# Patient Record
Sex: Female | Born: 1980 | Race: White | Hispanic: Yes | Marital: Single | Smoking: Never smoker
Health system: Southern US, Community
[De-identification: ages and names within clinical notes are randomized; demographics above are authoritative.]

## PROBLEM LIST (undated history)

## (undated) DIAGNOSIS — D649 Anemia, unspecified: Secondary | ICD-10-CM

## (undated) DIAGNOSIS — R112 Nausea with vomiting, unspecified: Secondary | ICD-10-CM

## (undated) DIAGNOSIS — J189 Pneumonia, unspecified organism: Secondary | ICD-10-CM

## (undated) DIAGNOSIS — Z9889 Other specified postprocedural states: Secondary | ICD-10-CM

## (undated) HISTORY — PX: WISDOM TOOTH EXTRACTION: SHX21

## (undated) HISTORY — PX: KNEE SURGERY: SHX244

## (undated) HISTORY — PX: BREAST ENHANCEMENT SURGERY: SHX7

## (undated) HISTORY — PX: OTHER SURGICAL HISTORY: SHX169

---

## 2018-05-02 ENCOUNTER — Encounter: Payer: Self-pay | Admitting: Emergency Medicine

## 2018-05-02 ENCOUNTER — Other Ambulatory Visit: Payer: Self-pay

## 2018-05-02 ENCOUNTER — Ambulatory Visit
Admission: EM | Admit: 2018-05-02 | Discharge: 2018-05-02 | Disposition: A | Discharge status: Home / Self Care | Payer: BC Managed Care – PPO | Attending: Family Medicine | Admitting: Family Medicine

## 2018-05-02 DIAGNOSIS — S8002XA Contusion of left knee, initial encounter: Secondary | ICD-10-CM

## 2018-05-02 DIAGNOSIS — S300XXA Contusion of lower back and pelvis, initial encounter: Secondary | ICD-10-CM

## 2018-05-02 MED ORDER — NAPROXEN 500 MG PO TABS: 500.0000 mg | ORAL_TABLET | Freq: Two times a day (BID) | ORAL | 0 refills | Status: DC

## 2018-05-02 NOTE — ED Triage Notes (Signed)
Patient fell today stating that she slipped and fell today and landed on her buttocks today. Patient is c/o pain in her tail bone, left hip and bilateral knees and head pain. Patient is unsure if she hit her head or not.

## 2018-05-02 NOTE — Discharge Instructions (Addendum)
Apply ice 20 minutes out of every 2 hours 4-5 times daily for comfort.  °

## 2018-05-02 NOTE — ED Provider Notes (Signed)
[facility]    CSN: [id number] Arrival date & time: 05/02/18  1654     History   Chief Complaint Chief Complaint  Patient presents with  . Fall    DOI 05/02/18    HPI Hannah Mora is a 37 y.o. female.   HPI  37 year old female states that performing a walk-through inspection of her new home that she closes on tomorrow the floors had been previously waxed and she slipped against the walls and eventually ending up on her left buttock.  States she was embarrassed but later on that day while at work noticed that sitting caused her to have more discomfort.  She indicates the area on the left buttock lateral to the cleft.  Has minor discomfort extending out through the left sacroiliac joint.  She hit the left side of her head on the wall but had no sustained loss of consciousness.  Complains of anterior left knee pain indicating the tibial tubercle.  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        History reviewed. No pertinent past medical history.  There are no active problems to display for this patient.   Past Surgical History:  Procedure Laterality Date  . BREAST ENHANCEMENT SURGERY    . KNEE SURGERY Right    x 3    OB History   None      Home Medications    Prior to Admission medications   Medication Sig Start Date End Date Taking? Authorizing Provider  naproxen (NAPROSYN) 500 MG tablet Take 1 tablet (500 mg total) by mouth 2 (two) times daily with a meal. 05/02/18   Lutricia Feil, PA-C    Family History Family History  Problem Relation Age of Onset  . Diabetes Mother   . Hyperlipidemia Mother   . Hypertension Mother   . Asthma Mother   . Healthy Father     Social History Social History   Tobacco Use  . Smoking status: Never Smoker  . Smokeless tobacco: Never Used  Substance Use Topics  . Alcohol use: Yes    Comment: rarely  . Drug use: Never     Allergies   Patient has no  known allergies.   Review of Systems Review of Systems  Constitutional: Positive for activity change. Negative for appetite change, chills, fatigue and fever.  Musculoskeletal: Positive for back pain and myalgias.  All other systems reviewed and are negative.    Physical Exam Triage Vital Signs ED Triage Vitals  Enc Vitals Group     BP 05/02/18 1712 116/79     Pulse Rate 05/02/18 1712 76     Resp 05/02/18 1712 16     Temp 05/02/18 1712 98.2 F (36.8 C)     Temp Source 05/02/18 1712 Oral     SpO2 05/02/18 1712 100 %     Weight 05/02/18 1711 150 lb (68 kg)     Height 05/02/18 1711 5\' 2"  (1.575 m)     Head Circumference --      Peak Flow --      Pain Score 05/02/18 1711 7     Pain Loc --      Pain Edu? --      Excl. in [place]? --    No data found.  Updated Vital Signs BP 116/79 (BP Location: Left Arm)   Pulse 76   Temp 98.2 F (36.8 C) (Oral)   Resp 16   Ht 5'  2" (1.575 m)   Wt 150 lb (68 kg)   SpO2 100%   BMI 27.44 kg/m   Visual Acuity Right Eye Distance:   Left Eye Distance:   Bilateral Distance:    Right Eye Near:   Left Eye Near:    Bilateral Near:     Physical Exam  Constitutional: She is oriented to person, place, and time. She appears well-developed and well-nourished. No distress.  HENT:  Head: Normocephalic.  Eyes: Pupils are equal, round, and reactive to light. Right eye exhibits no discharge. Left eye exhibits no discharge.  Neck: Normal range of motion.  Musculoskeletal: Normal range of motion. She exhibits tenderness.  Exam of the lumbar spine was performed with Efraim KaufmannMelissa, CMA as chaperone and assistant.  Is no obvious bruising present on the lower back buttocks or lateral hip left.  Tenderness is just lateral to the buttocks cleft.  She also has tenderness over the lateral hip but again there is no bruising present.  Good range of motion to forward flexion and to lateral flexion bilaterally with discomfort at the extremes of left rightward lateral  flexion.  Exam of the left knee shows a small bruise over the tibial tubercle.  There is no effusion present.  She has no tenderness of the patella.  There is no lateral joint line tenderness.  No tenderness over the collateral ligaments bilaterally.  Collateral ligaments are intact when tested with stress.  She has a lax anterior cruciate but it does have a definite endpoint.  Range of motion of her knee.   Neurological: She is alert and oriented to person, place, and time.  Skin: Skin is warm and dry. She is not diaphoretic.  Psychiatric: She has a normal mood and affect. Her behavior is normal. Judgment and thought content normal.  Nursing note and vitals reviewed.    UC Treatments / Results  Labs (all labs ordered are listed, but only abnormal results are displayed) Labs Reviewed - No data to display  EKG None  Radiology No results found.  Procedures Procedures (including critical care time)  Medications Ordered in UC Medications - No data to display  Initial Impression / Assessment and Plan / UC Course  I have reviewed the triage vital signs and the nursing notes.  Pertinent labs & imaging results that were available during my care of the patient were reviewed by me and considered in my medical decision making (see chart for details).     Plan: 1. Test/x-ray results and diagnosis reviewed with patient 2. rx as per orders; risks, benefits, potential side effects reviewed with patient 3. Recommend supportive treatment with Apply ice 20 minutes out of every 2 hours 4-5 times daily for comfort.  I have advised the patient to take a conservative approach and see how her pain develops over time.  X-rays  are not indicated today.   we will treat her conservatively with ice, rest, symptom avoidance, Naprosyn with gradual resumption of her activities.  If she is not improving she may follow-up in our clinic or find a PCP. 4. F/u prn if symptoms worsen or don't improve  Final  Clinical Impressions(s) / UC Diagnoses   Final diagnoses:  Contusion of pelvic region, initial encounter  Contusion of left knee, initial encounter     Discharge Instructions     Apply ice 20 minutes out of every 2 hours 4-5 times daily for comfort.    ED Prescriptions    Medication Sig Dispense Auth. Provider  naproxen (NAPROSYN) 500 MG tablet Take 1 tablet (500 mg total) by mouth 2 (two) times daily with a meal. 60 tablet Lutricia Feil, PA-C     Controlled Substance Prescriptions [place] Controlled Substance Registry consulted? Not Applicable   Lutricia Feil, PA-C 05/02/18 1905

## 2020-02-11 ENCOUNTER — Ambulatory Visit
Admission: EM | Admit: 2020-02-11 | Discharge: 2020-02-11 | Disposition: A | Discharge status: Home / Self Care | Payer: BC Managed Care – PPO | Attending: Family Medicine | Admitting: Family Medicine

## 2020-02-11 ENCOUNTER — Other Ambulatory Visit: Payer: Self-pay

## 2020-02-11 ENCOUNTER — Ambulatory Visit (INDEPENDENT_AMBULATORY_CARE_PROVIDER_SITE_OTHER): Payer: BC Managed Care – PPO

## 2020-02-11 DIAGNOSIS — S6992XA Unspecified injury of left wrist, hand and finger(s), initial encounter: Secondary | ICD-10-CM

## 2020-02-11 NOTE — ED Provider Notes (Addendum)
[facility]    CSN: [id number] Arrival date & time: 02/11/20  1806  History   Chief Complaint Chief Complaint  Patient presents with  . Finger Injury    HPI 39 year old female presents with the above complaint.  Patient states that she was picking up a bag of laundry.  In doing so she inadvertently grabbed a laundry bag with her middle finger instead of all of her digits.  She states that her finger extended back quite a bit.  She states that in doing so she injured her finger.  She reports swelling of the digit also does not feel like it is straight.  He localizes the pain distal to the PIP joint.  She is not able to fully flex the joint but can flex some.  She is able to extend the joint as well as the DIP joint.  Injury occurred last night.  No relieving factors.  No other complaints.  Home Medications    Prior to Admission medications   Medication Sig Start Date End Date Taking? Authorizing Provider  Norgestimate-Ethinyl Estradiol Triphasic 0.18/0.215/0.25 MG-35 MCG tablet Take by mouth. 07/02/19 07/01/20 Yes [provider]  desogestrel-ethinyl estradiol (APRI) 0.15-30 MG-MCG tablet Take by mouth. 05/03/19 02/11/20 Yes [provider]    Family History Family History  Problem Relation Age of Onset  . Diabetes Mother   . Hyperlipidemia Mother   . Hypertension Mother   . Asthma Mother   . Healthy Father     Social History Social History   Tobacco Use  . Smoking status: Never Smoker  . Smokeless tobacco: Never Used  Vaping Use  . Vaping Use: Never used  Substance Use Topics  . Alcohol use: Yes    Comment: rarely  . Drug use: Never     Allergies   Patient has no known allergies.   Review of Systems Review of Systems  Musculoskeletal:       Finger injury - Right middle finger.   Physical Exam Triage Vital Signs ED Triage Vitals  Enc Vitals Group     BP 02/11/20 1828 119/84     Pulse Rate 02/11/20 1828 74     Resp 02/11/20  1828 15     Temp 02/11/20 1828 98.2 F (36.8 C)     Temp Source 02/11/20 1828 Oral     SpO2 02/11/20 1828 100 %     Weight 02/11/20 1827 150 lb (68 kg)     Height 02/11/20 1827 5\' 3"  (1.6 m)     Head Circumference --      Peak Flow --      Pain Score 02/11/20 1827 7     Pain Loc --      Pain Edu? --      Excl. in [place]? --    Updated Vital Signs BP 119/84 (BP Location: Left Arm)   Pulse 74   Temp 98.2 F (36.8 C) (Oral)   Resp 15   Ht 5\' 3"  (1.6 m)   Wt 68 kg   LMP 02/05/2020   SpO2 100%   BMI 26.57 kg/m   Visual Acuity Right Eye Distance:   Left Eye Distance:   Bilateral Distance:    Right Eye Near:   Left Eye Near:    Bilateral Near:     Physical Exam Constitutional:      General: She is not in acute distress.    Appearance: Normal appearance. She is not ill-appearing.  HENT:     Head:  Normocephalic and atraumatic.  Eyes:     General:        Right eye: No discharge.        Left eye: No discharge.     Conjunctiva/sclera: Conjunctivae normal.  Pulmonary:     Effort: Pulmonary effort is normal. No respiratory distress.  Musculoskeletal:     Comments: Right middle finger with tenderness distal to the PIP joint.  Minimal swelling.  Patient is able to flex at the DIP joint as well as the PIP joint.  She cannot fully flex at the PIP joint but flexion is intact.  Patient is able to extend at the PIP joint as well as the DIP joint.   Skin:    General: Skin is warm.     Findings: No bruising or rash.  Neurological:     Mental Status: She is alert.  Psychiatric:        Mood and Affect: Mood normal.        Behavior: Behavior normal.    UC Treatments / Results  Labs (all labs ordered are listed, but only abnormal results are displayed) Labs Reviewed - No data to display  EKG   Radiology No results found.  Procedures Procedures (including critical care time)  Medications Ordered in UC Medications - No data to display  Initial Impression / Assessment and  Plan / UC Course  I have reviewed the triage vital signs and the nursing notes.  Pertinent labs & imaging results that were available during my care of the patient were reviewed by me and considered in my medical decision making (see chart for details).    40 year old female presents with finger injury.  X-ray obtained today and independently reviewed by me.  No evidence of fracture.  There is no appreciable tendon injury.  Patient feels that her finger is deviated particularly distal to the DIP joint.  It is unclear to me whether this is a normal variation or other true pathology is there.  I do not appreciate any significant pathology on exam or on x-ray.  Advised rest, ice, elevation.  Ibuprofen as needed.  Advised to follow-up with hand surgery.  Final Clinical Impressions(s) / UC Diagnoses   Final diagnoses:  Injury of finger of left hand, initial encounter     Discharge Instructions     No evidence of fracture.  Please call [facility] [phone]) for an appt; Recommend seeing Dr. Stephenie Acres.  Rest, ice.  Ibuprofen as needed.  Take care  Dr. Adriana Simas      ED Prescriptions    None     PDMP not reviewed this encounter.   Tommie Sams, DO 02/11/20 2207    Tommie Sams, DO 03/18/20 [phone]

## 2020-02-11 NOTE — ED Triage Notes (Signed)
Pt reports she picked up a heavy laundry bad with her middle finger and it bent. Now swollen and still not quite straight.

## 2020-02-11 NOTE — Discharge Instructions (Signed)
No evidence of fracture.  Please call [facility] [phone]) for an appt; Recommend seeing Dr. Stephenie Acres.  Rest, ice.  Ibuprofen as needed.  Take care  Dr. Adriana Simas

## 2020-09-06 DIAGNOSIS — U071 COVID-19: Secondary | ICD-10-CM

## 2020-09-06 HISTORY — DX: COVID-19: U07.1

## 2021-04-07 ENCOUNTER — Observation Stay
Admission: AD | Admit: 2021-04-07 | Discharge: 2021-04-09 | Disposition: A | Discharge status: Home / Self Care | Payer: BC Managed Care – PPO | Attending: Emergency Medicine | Admitting: Emergency Medicine

## 2021-04-07 ENCOUNTER — Other Ambulatory Visit: Payer: Self-pay

## 2021-04-07 ENCOUNTER — Emergency Department: Payer: BC Managed Care – PPO

## 2021-04-07 ENCOUNTER — Encounter
Admission: AD | Disposition: A | Discharge status: Home / Self Care | Payer: Self-pay | Source: Ambulatory Visit | Attending: Surgery

## 2021-04-07 ENCOUNTER — Emergency Department
Admission: EM | Admit: 2021-04-07 | Discharge: 2021-04-07 | Disposition: A | Discharge status: Home / Self Care | Payer: BC Managed Care – PPO | Source: Home / Self Care | Attending: Emergency Medicine | Admitting: Emergency Medicine

## 2021-04-07 ENCOUNTER — Encounter: Payer: Self-pay | Admitting: Anesthesiology

## 2021-04-07 DIAGNOSIS — R109 Unspecified abdominal pain: Secondary | ICD-10-CM

## 2021-04-07 DIAGNOSIS — Z825 Family history of asthma and other chronic lower respiratory diseases: Secondary | ICD-10-CM

## 2021-04-07 DIAGNOSIS — U071 COVID-19: Secondary | ICD-10-CM | POA: Insufficient documentation

## 2021-04-07 DIAGNOSIS — K8050 Calculus of bile duct without cholangitis or cholecystitis without obstruction: Secondary | ICD-10-CM | POA: Insufficient documentation

## 2021-04-07 DIAGNOSIS — Z833 Family history of diabetes mellitus: Secondary | ICD-10-CM | POA: Diagnosis not present

## 2021-04-07 DIAGNOSIS — K810 Acute cholecystitis: Principal | ICD-10-CM | POA: Diagnosis present

## 2021-04-07 DIAGNOSIS — K8000 Calculus of gallbladder with acute cholecystitis without obstruction: Principal | ICD-10-CM | POA: Diagnosis present

## 2021-04-07 DIAGNOSIS — Z803 Family history of malignant neoplasm of breast: Secondary | ICD-10-CM

## 2021-04-07 DIAGNOSIS — Z83438 Family history of other disorder of lipoprotein metabolism and other lipidemia: Secondary | ICD-10-CM

## 2021-04-07 DIAGNOSIS — M549 Dorsalgia, unspecified: Secondary | ICD-10-CM | POA: Insufficient documentation

## 2021-04-07 DIAGNOSIS — R1010 Upper abdominal pain, unspecified: Secondary | ICD-10-CM | POA: Diagnosis present

## 2021-04-07 DIAGNOSIS — Z8249 Family history of ischemic heart disease and other diseases of the circulatory system: Secondary | ICD-10-CM | POA: Diagnosis not present

## 2021-04-07 LAB — COMPREHENSIVE METABOLIC PANEL
ALT: 13 U/L (ref 0–44)
AST: 20 U/L (ref 15–41)
Albumin: 4.2 g/dL (ref 3.5–5.0)
Alkaline Phosphatase: 97 U/L (ref 38–126)
Anion gap: 6 (ref 5–15)
BUN: 15 mg/dL (ref 6–20)
CO2: 26 mmol/L (ref 22–32)
Calcium: 8.8 mg/dL — ABNORMAL LOW (ref 8.9–10.3)
Chloride: 105 mmol/L (ref 98–111)
Creatinine, Ser: 0.7 mg/dL (ref 0.44–1.00)
GFR, Estimated: >60 mL/min (ref >60)
Glucose, Bld: 116 mg/dL — ABNORMAL HIGH (ref 70–99)
Potassium: 3.8 mmol/L (ref 3.5–5.1)
Sodium: 137 mmol/L (ref 135–145)
Total Bilirubin: 0.9 mg/dL (ref 0.3–1.2)
Total Protein: 7.6 g/dL (ref 6.5–8.1)

## 2021-04-07 LAB — CBC
HCT: 38.1 % (ref 36.0–46.0)
Hemoglobin: 12.6 g/dL (ref 12.0–15.0)
MCH: 27.6 pg (ref 26.0–34.0)
MCHC: 33.1 g/dL (ref 30.0–36.0)
MCV: 83.6 fL (ref 80.0–100.0)
Platelets: 253 10*3/uL (ref 150–400)
RBC: 4.56 MIL/uL (ref 3.87–5.11)
RDW: 13.9 % (ref 11.5–15.5)
WBC: 9.9 10*3/uL (ref 4.0–10.5)
nRBC: 0 % (ref 0.0–0.2)

## 2021-04-07 LAB — URINALYSIS, COMPLETE (UACMP) WITH MICROSCOPIC
Bilirubin Urine: NEGATIVE
Glucose, UA: NEGATIVE mg/dL
Hgb urine dipstick: NEGATIVE
Ketones, ur: 5 mg/dL — AB
Nitrite: NEGATIVE
Protein, ur: NEGATIVE mg/dL
Specific Gravity, Urine: 1.028 (ref 1.005–1.030)
pH: 6 (ref 5.0–8.0)

## 2021-04-07 LAB — PREGNANCY, URINE: Preg Test, Ur: NEGATIVE

## 2021-04-07 LAB — LIPASE, BLOOD: Lipase: 51 U/L (ref 11–51)

## 2021-04-07 LAB — SARS CORONAVIRUS 2 BY RT PCR (HOSPITAL ORDER, PERFORMED IN ~~LOC~~ HOSPITAL LAB): SARS Coronavirus 2: POSITIVE — AB

## 2021-04-07 SURGERY — CHOLECYSTECTOMY, ROBOT-ASSISTED, LAPAROSCOPIC
Anesthesia: Choice

## 2021-04-07 MED ORDER — OXYCODONE-ACETAMINOPHEN 5-325 MG PO TABS: 1.0000 | ORAL_TABLET | ORAL | Status: DC | PRN

## 2021-04-07 MED ORDER — IOHEXOL 350 MG/ML SOLN
100.0000 mL | Freq: Once | INTRAVENOUS | Status: AC | PRN
  Administered 2021-04-07: 100 mL via INTRAVENOUS
  Filled 2021-04-07: qty 100

## 2021-04-07 MED ORDER — MORPHINE SULFATE (PF) 2 MG/ML IV SOLN: 2.0000 mg | INTRAVENOUS | Status: DC | PRN

## 2021-04-07 MED ORDER — ONDANSETRON 4 MG PO TBDP: 4.0000 mg | ORAL_TABLET | Freq: Three times a day (TID) | ORAL | 0 refills | Status: AC | PRN

## 2021-04-07 MED ORDER — TRAMADOL HCL 50 MG PO TABS: 50.0000 mg | ORAL_TABLET | Freq: Four times a day (QID) | ORAL | Status: DC | PRN

## 2021-04-07 MED ORDER — ONDANSETRON 4 MG PO TBDP
4.0000 mg | ORAL_TABLET | Freq: Once | ORAL | Status: AC | PRN
  Administered 2021-04-07: 4 mg via ORAL
  Filled 2021-04-07: qty 1

## 2021-04-07 MED ORDER — EPINEPHRINE PF 1 MG/ML IJ SOLN
INTRAMUSCULAR | Status: AC
  Filled 2021-04-07: qty 1

## 2021-04-07 MED ORDER — LIDOCAINE HCL (PF) 1 % IJ SOLN
INTRAMUSCULAR | Status: AC
  Filled 2021-04-07: qty 30

## 2021-04-07 MED ORDER — INDOCYANINE GREEN 25 MG IV SOLR
1.2500 mg | Freq: Once | INTRAVENOUS | Status: DC
  Filled 2021-04-07: qty 10

## 2021-04-07 MED ORDER — FENTANYL CITRATE (PF) 100 MCG/2ML IJ SOLN
INTRAMUSCULAR | Status: AC
  Filled 2021-04-07: qty 2

## 2021-04-07 MED ORDER — SODIUM CHLORIDE 0.9 % IV SOLN
2.0000 g | INTRAVENOUS | Status: DC
  Administered 2021-04-07 – 2021-04-08 (×2): 2 g via INTRAVENOUS
  Filled 2021-04-07 (×2): qty 2

## 2021-04-07 MED ORDER — LACTATED RINGERS IV SOLN: INTRAVENOUS | Status: DC

## 2021-04-07 MED ORDER — PROPOFOL 10 MG/ML IV BOLUS
INTRAVENOUS | Status: AC
  Filled 2021-04-07: qty 20

## 2021-04-07 MED ORDER — HYDROCODONE-ACETAMINOPHEN 5-325 MG PO TABS
1.0000 | ORAL_TABLET | ORAL | Status: DC | PRN
Start: 2021-04-07 — End: 2021-04-10

## 2021-04-07 MED ORDER — MIDAZOLAM HCL 2 MG/2ML IJ SOLN
INTRAMUSCULAR | Status: AC
  Filled 2021-04-07: qty 2

## 2021-04-07 MED ORDER — OXYCODONE-ACETAMINOPHEN 5-325 MG PO TABS: 1.0000 | ORAL_TABLET | ORAL | 0 refills | Status: DC | PRN

## 2021-04-07 MED ORDER — ONDANSETRON 4 MG PO TBDP: 4.0000 mg | ORAL_TABLET | Freq: Three times a day (TID) | ORAL | Status: DC | PRN

## 2021-04-07 MED ORDER — DOCUSATE SODIUM 100 MG PO CAPS: 100.0000 mg | ORAL_CAPSULE | Freq: Two times a day (BID) | ORAL | Status: DC | PRN

## 2021-04-07 MED ORDER — BUPIVACAINE HCL (PF) 0.5 % IJ SOLN
INTRAMUSCULAR | Status: AC
  Filled 2021-04-07: qty 30

## 2021-04-07 SURGICAL SUPPLY — 54 items
ANCHOR TIS RET SYS 235ML (MISCELLANEOUS) ×2 IMPLANT
BAG INFUSER PRESSURE 100CC (MISCELLANEOUS) IMPLANT
BLADE SURG SZ11 CARB STEEL (BLADE) ×2 IMPLANT
CANISTER SUCT 1200ML W/VALVE (MISCELLANEOUS) ×2 IMPLANT
CANNULA REDUC XI 12-8 STAPL (CANNULA) ×1
CANNULA REDUCER 12-8 DVNC XI (CANNULA) ×1 IMPLANT
CATH REDDICK CHOLANGI 4FR 50CM (CATHETERS) IMPLANT
CHLORAPREP W/TINT 26 (MISCELLANEOUS) ×2 IMPLANT
CLIP VESOLOCK MED LG 6/CT (CLIP) ×2 IMPLANT
COVER TIP SHEARS 8 DVNC (MISCELLANEOUS) IMPLANT
COVER TIP SHEARS 8MM DA VINCI (MISCELLANEOUS)
DECANTER SPIKE VIAL GLASS SM (MISCELLANEOUS) ×4 IMPLANT
DEFOGGER SCOPE WARMER CLEARIFY (MISCELLANEOUS) ×2 IMPLANT
DERMABOND ADVANCED (GAUZE/BANDAGES/DRESSINGS) ×1
DERMABOND ADVANCED .7 DNX12 (GAUZE/BANDAGES/DRESSINGS) ×1 IMPLANT
DRAPE ARM DVNC X/XI (DISPOSABLE) ×4 IMPLANT
DRAPE C-ARM XRAY 36X54 (DRAPES) IMPLANT
DRAPE COLUMN DVNC XI (DISPOSABLE) ×1 IMPLANT
DRAPE DA VINCI XI ARM (DISPOSABLE) ×4
DRAPE DA VINCI XI COLUMN (DISPOSABLE) ×1
ELECT CAUTERY BLADE 6.4 (BLADE) ×2 IMPLANT
ELECT REM PT RETURN 9FT ADLT (ELECTROSURGICAL) ×2
ELECTRODE REM PT RTRN 9FT ADLT (ELECTROSURGICAL) ×1 IMPLANT
GAUZE 4X4 16PLY ~~LOC~~+RFID DBL (SPONGE) ×2 IMPLANT
GLOVE SURG SYN 6.5 ES PF (GLOVE) ×4 IMPLANT
GLOVE SURG UNDER POLY LF SZ7 (GLOVE) ×4 IMPLANT
GOWN STRL REUS W/ TWL LRG LVL3 (GOWN DISPOSABLE) ×3 IMPLANT
GOWN STRL REUS W/TWL LRG LVL3 (GOWN DISPOSABLE) ×3
GRASPER SUT TROCAR 14GX15 (MISCELLANEOUS) IMPLANT
IRRIGATOR SUCT 8 DISP DVNC XI (IRRIGATION / IRRIGATOR) IMPLANT
IRRIGATOR SUCTION 8MM XI DISP (IRRIGATION / IRRIGATOR)
IV NS 1000ML (IV SOLUTION)
IV NS 1000ML BAXH (IV SOLUTION) IMPLANT
LABEL OR SOLS (LABEL) ×2 IMPLANT
MANIFOLD NEPTUNE II (INSTRUMENTS) ×2 IMPLANT
NEEDLE HYPO 22GX1.5 SAFETY (NEEDLE) ×2 IMPLANT
NEEDLE INSUFFLATION 14GA 120MM (NEEDLE) ×2 IMPLANT
NS IRRIG 500ML POUR BTL (IV SOLUTION) ×2 IMPLANT
OBTURATOR OPTICAL STANDARD 8MM (TROCAR) ×1
OBTURATOR OPTICAL STND 8 DVNC (TROCAR) ×1
OBTURATOR OPTICALSTD 8 DVNC (TROCAR) ×1 IMPLANT
PACK LAP CHOLECYSTECTOMY (MISCELLANEOUS) ×2 IMPLANT
PENCIL ELECTRO HAND CTR (MISCELLANEOUS) ×2 IMPLANT
SEAL CANN UNIV 5-8 DVNC XI (MISCELLANEOUS) ×3 IMPLANT
SEAL XI 5MM-8MM UNIVERSAL (MISCELLANEOUS) ×3
SET TUBE SMOKE EVAC HIGH FLOW (TUBING) ×2 IMPLANT
SOLUTION ELECTROLUBE (MISCELLANEOUS) ×2 IMPLANT
STAPLER CANNULA SEAL DVNC XI (STAPLE) ×1 IMPLANT
STAPLER CANNULA SEAL XI (STAPLE) ×1
SUT MNCRL 4-0 (SUTURE) ×2
SUT MNCRL 4-0 27XMFL (SUTURE) ×2
SUT VICRYL 0 AB UR-6 (SUTURE) ×2 IMPLANT
SUTURE MNCRL 4-0 27XMF (SUTURE) ×2 IMPLANT
SYR 30ML LL (SYRINGE) IMPLANT

## 2021-04-07 NOTE — ED Notes (Signed)
Explained the wait    

## 2021-04-07 NOTE — ED Triage Notes (Signed)
Pt presents to ER c/o mid-back pain that radiates to abdomen. Pt states this has been going on for 1.5 weeks but has become worse in last 3 days.  Pt reports associated n/v with the pain.  Pt states she feels pain more at night, but cannot tell this rn if anything makes it better or worse.  Pt denies any pertinent pmh.

## 2021-04-07 NOTE — ED Provider Notes (Signed)
[facility] Emergency Department Provider Note   ____________________________________________   Event Date/Time   First MD Initiated Contact with Patient 04/07/21 [phone]     (approximate)  I have reviewed the triage vital signs and the nursing notes.   HISTORY  Chief Complaint Abdominal Pain and Back Pain    HPI Hannah Mora is a 40 y.o. female with no significant past medical history who presents to the ED complaining of back and abdominal pain.  Patient reports that she has had constant, gradually worsening pain in her bilateral flanks moving into her upper abdomen.  Pain is described as sharp and not exacerbated or alleviated by anything.  She states she will feel nauseous at times, but she has not vomited and denies any diarrhea, does endorse some constipation.  She has not had any fevers, dysuria, hematuria, vaginal bleeding, or discharge.  Her LMP was approximately 1 month ago and she does not think she could be pregnant.  She denies any cough, chest pain, or shortness of breath.  She has never had surgery on her abdomen, denies any past medical history.        History reviewed. No pertinent past medical history.  There are no problems to display for this patient.   Past Surgical History:  Procedure Laterality Date   BREAST ENHANCEMENT SURGERY     KNEE SURGERY Right    x 3    Prior to Admission medications   Medication Sig Start Date End Date Taking? Authorizing Provider  ondansetron (ZOFRAN ODT) 4 MG disintegrating tablet Take 1 tablet (4 mg total) by mouth every 8 (eight) hours as needed for nausea or vomiting. 04/07/21  Yes Chesley Noon, MD  oxyCODONE-acetaminophen (PERCOCET) 5-325 MG tablet Take 1 tablet by mouth every 4 (four) hours as needed for severe pain. 04/07/21 04/07/22 Yes Chesley Noon, MD  Norgestimate-Ethinyl Estradiol Triphasic 0.18/0.215/0.25 MG-35 MCG tablet Take by mouth. 07/02/19 07/01/20  [provider]   desogestrel-ethinyl estradiol (APRI) 0.15-30 MG-MCG tablet Take by mouth. 05/03/19 02/11/20  [provider]    Allergies Patient has no known allergies.  Family History  Problem Relation Age of Onset   Diabetes Mother    Hyperlipidemia Mother    Hypertension Mother    Asthma Mother    Healthy Father     Social History Social History   Tobacco Use   Smoking status: Never   Smokeless tobacco: Never  Vaping Use   Vaping Use: Never used  Substance Use Topics   Alcohol use: Yes    Comment: rarely   Drug use: Never    Review of Systems  Constitutional: No fever/chills Eyes: No visual changes. ENT: No sore throat. Cardiovascular: Denies chest pain. Respiratory: Denies shortness of breath. Gastrointestinal: Positive for flank pain, abdominal pain, and nausea, no vomiting.  No diarrhea.  No constipation. Genitourinary: Negative for dysuria. Musculoskeletal: Positive for back pain. Skin: Negative for rash. Neurological: Negative for headaches, focal weakness or numbness.  ____________________________________________   PHYSICAL EXAM:  VITAL SIGNS: ED Triage Vitals  Enc Vitals Group     BP 04/07/21 0242 (!) 149/83     Pulse Rate 04/07/21 0242 77     Resp 04/07/21 0242 16     Temp 04/07/21 0242 98.7 F (37.1 C)     Temp Source 04/07/21 0242 Oral     SpO2 04/07/21 0242 100 %     Weight 04/07/21 0240 155 lb (70.3 kg)     Height 04/07/21 0240 5\' 2"  (  1.575 m)     Head Circumference --      Peak Flow --      Pain Score 04/07/21 0240 10     Pain Loc --      Pain Edu? --      Excl. in [place]? --     Constitutional: Alert and oriented. Eyes: Conjunctivae are normal. Head: Atraumatic. Nose: No congestion/rhinnorhea. Mouth/Throat: Mucous membranes are moist. Neck: Normal ROM Cardiovascular: Normal rate, regular rhythm. Grossly normal heart sounds.  2+ radial pulses bilaterally. Respiratory: Normal respiratory effort.  No retractions. Lungs  CTAB. Gastrointestinal: Soft and tender to palpation in bilateral upper quadrants with no rebound or guarding.  CVA tenderness noted bilaterally. No distention. Genitourinary: deferred Musculoskeletal: No lower extremity tenderness nor edema. Neurologic:  Normal speech and language. No gross focal neurologic deficits are appreciated. Skin:  Skin is warm, dry and intact. No rash noted. Psychiatric: Mood and affect are normal. Speech and behavior are normal.  ____________________________________________   LABS (all labs ordered are listed, but only abnormal results are displayed)  Labs Reviewed  COMPREHENSIVE METABOLIC PANEL - Abnormal; Notable for the following components:      Result Value   Glucose, Bld 116 (*)    Calcium 8.8 (*)    All other components within normal limits  URINALYSIS, COMPLETE (UACMP) WITH MICROSCOPIC - Abnormal; Notable for the following components:   Color, Urine YELLOW (*)    APPearance HAZY (*)    Ketones, ur 5 (*)    Leukocytes,Ua TRACE (*)    Bacteria, UA RARE (*)    All other components within normal limits  LIPASE, BLOOD  CBC  PREGNANCY, URINE  POC URINE PREG, ED    PROCEDURES  Procedure(s) performed (including Critical Care):  Procedures   ____________________________________________   INITIAL IMPRESSION / ASSESSMENT AND PLAN / ED COURSE      40 year old female with no significant past medical history presents to the ED with 2 weeks of bilateral back/flank pain that seems to move into the bilateral upper quadrants of her abdomen.  Patient with tenderness to bilateral upper quadrants along with bilateral costovertebral areas on my assessment.  Initial labs are unremarkable and UA shows no signs of infection, pregnancy testing is negative.  We will further assess with CT scan to rule out kidney stones or biliary pathology, if this is unremarkable I suspect patient's symptoms are due to gastritis versus peptic ulcer disease.  CT scan is  significant for Cholelithiasis with borderline thickening of gallbladder wall.  We will further assess with ultrasound but low suspicion for cholecystitis at this time as patient states pain is resolving.  Patient turned over to oncoming provider pending ultrasound results, if these are unremarkable she would be appropriate for discharge home with general surgery follow-up, was prescribed small amount of pain and nausea medication.      ____________________________________________   FINAL CLINICAL IMPRESSION(S) / ED DIAGNOSES  Final diagnoses:  Abdominal pain  Biliary colic     ED Discharge Orders          Ordered    oxyCODONE-acetaminophen (PERCOCET) 5-325 MG tablet  Every 4 hours PRN        04/07/21 1103    ondansetron (ZOFRAN ODT) 4 MG disintegrating tablet  Every 8 hours PRN        04/07/21 1103             Note:  This document was prepared using Dragon voice recognition software and may include unintentional dictation  errors.    Chesley Noon, MD 04/07/21 1104

## 2021-04-07 NOTE — ED Notes (Signed)
Pt pregnancy test negative. 

## 2021-04-07 NOTE — ED Provider Notes (Signed)
Patient care assumed from Dr. Larinda Buttery.  Patient's ultrasound is equivocal however patient states she is now pain-free.  Normal LFTs, normal white blood cell count.  Suspect likely biliary colic.  Discussed with the patient surgery follow-up.  We will prescribe a short course of pain medication to use if needed.  Patient is agreeable to this plan of care.   Hannah Antis, MD 04/07/21 1315

## 2021-04-07 NOTE — OR Nursing (Signed)
Dr. Tonna Boehringer and Dr. Suzan Slick notified about pt being Covid +

## 2021-04-07 NOTE — OR Nursing (Signed)
Surgery postponed, floor RN notified and Anesthesia team.

## 2021-04-07 NOTE — ED Notes (Signed)
Dr Lenard Lance notified that pt states "I know I need surgery but I have some questions".  Awaiting ultrasound results, ultrasound tech states radiologist is reading her scan now.  Will update pt on status of ultrasound.

## 2021-04-07 NOTE — Progress Notes (Signed)
Covid positive. Discussed increased perioperative risks noted even in asymptomatic individuals based on limited data. Initially recommended abx to see if acute episode can be managed prior to proceeding in a few weeks as long as patient remains asymptomatic. She verbalized understanding and wishes to postpone surgery as long as pain can be controlled

## 2021-04-07 NOTE — H&P (Signed)
saSubjective:   CC: Acute cholecystitis [K81.0]  HPI: Hannah Mora is a 39 y.o. female who is consulted by [facility] for evaluation of above cc.  Symptoms were first noted 1 day ago. Pain is sharp  Associated with nausea, exacerbated by nothing specific.  Past Medical History:  has a past medical history of Pneumonia.  Past Surgical History:  has a past surgical history that includes Knee arthroscopy; Combined augmentation mammaplasty and abdominoplasty; and Augmentation mammaplasty (Bilateral).  Family History: family history includes Asthma in her mother; Breast cancer in her maternal grandmother; Diabetes in her mother; Diabetes type II in her mother; High blood pressure (Hypertension) in her mother; Hyperlipidemia (Elevated cholesterol) in her mother.  Social History:  reports that she has never smoked. She has never used smokeless tobacco. She reports previous drug use. She reports that she does not drink alcohol.  Current Medications: has a current medication list which includes the following prescription(s): acetaminophen, ascorbic acid (vitamin c), benzoyl peroxide, cholecalciferol, multivitamin, norgest-ethinyl estradiol triphasic, omeprazole, ondansetron, and oxycodone-acetaminophen.  Allergies:  Allergies as of 04/07/2021   (No Known Allergies)    ROS:  A 15 point review of systems was performed and pertinent positives and negatives noted in HPI    Objective:     BP 135/86   Pulse 102   Ht 157.5 cm (5\' 2" )   Wt 70.8 kg (156 lb)   BMI 28.53 kg/m    Constitutional :  alert, appears stated age, cooperative and no distress  Lymphatics/Throat:  no asymmetry, masses, or scars  Respiratory:  clear to auscultation bilaterally  Cardiovascular:  regular rate and rhythm  Gastrointestinal: soft, no guarding, TTP noted in RUQ.    Musculoskeletal: Steady gait and movement  Skin: Cool and moist  Psychiatric: Normal affect, non-agitated, not confused       LABS:  wnl    RADS: CLINICAL DATA:  Right upper quadrant and back pain   EXAM:  ULTRASOUND ABDOMEN LIMITED RIGHT UPPER QUADRANT   COMPARISON:  None.   FINDINGS:  Gallbladder:   A single large gallstone measuring up to 2.7 cm is visualized.  Sludge is present. Gallbladder wall thickening, measuring up to 5  mm. No pericholecystic fluid. Negative sonographic Murphy sign.   Common bile duct:   Diameter: 3.0   Liver:   No focal lesion identified. Within normal limits in parenchymal  echogenicity. Portal vein is patent on color Doppler imaging with  normal direction of blood flow towards the liver.   Other: None.   IMPRESSION:  Gallbladder wall thickening with a single large gallstone  visualized. Sonographic sign is negative. Findings are  equivocal for acute cholecystitis.    Electronically Signed    By: Eulah Pont MD    On: 04/07/2021 13:09  Assessment:      Acute cholecystitis [K81.0]  Plan:     1. Acute cholecystitis [K81.0] Discussed the risk of surgery including post-op infxn, seroma, biloma, chronic pain, poor-delayed wound healing, retained gallstone, conversion to open procedure, post-op SBO or ileus, and need for additional procedures to address said risks.  The risks of general anesthetic including MI, CVA, sudden death or even reaction to anesthetic medications also discussed. Alternatives include continued observation.  Benefits include possible symptom relief, prevention of complications including acute cholecystitis, pancreatitis.  Typical post operative recovery of 3-5 days rest, continued pain in area and incision sites, possible loose stools up to 4-6 weeks, also discussed.  ED return precautions given for sudden increase in RUQ  pain, with possible accompanying fever, nausea, and/or vomiting.  The patient understands the risks, any and all questions were answered to the patient's satisfaction.  2. Patient has elected to proceed with surgical  treatment. Procedure will be scheduled.  Written consent was obtained. robotic assisted laparoscopic.  Admit in the meantime

## 2021-04-08 ENCOUNTER — Encounter: Payer: Self-pay | Admitting: Surgery

## 2021-04-08 LAB — CBC
HCT: 36.7 % (ref 36.0–46.0)
Hemoglobin: 12.4 g/dL (ref 12.0–15.0)
MCH: 28.1 pg (ref 26.0–34.0)
MCHC: 33.8 g/dL (ref 30.0–36.0)
MCV: 83 fL (ref 80.0–100.0)
Platelets: 231 10*3/uL (ref 150–400)
RBC: 4.42 MIL/uL (ref 3.87–5.11)
RDW: 14.2 % (ref 11.5–15.5)
WBC: 8.4 10*3/uL (ref 4.0–10.5)
nRBC: 0 % (ref 0.0–0.2)

## 2021-04-08 LAB — HIV ANTIBODY (ROUTINE TESTING W REFLEX): HIV Screen 4th Generation wRfx: NONREACTIVE

## 2021-04-08 LAB — BASIC METABOLIC PANEL
Anion gap: 9 (ref 5–15)
BUN: 10 mg/dL (ref 6–20)
CO2: 24 mmol/L (ref 22–32)
Calcium: 8.9 mg/dL (ref 8.9–10.3)
Chloride: 104 mmol/L (ref 98–111)
Creatinine, Ser: 0.7 mg/dL (ref 0.44–1.00)
GFR, Estimated: >60 mL/min (ref >60)
Glucose, Bld: 100 mg/dL — ABNORMAL HIGH (ref 70–99)
Potassium: 3.8 mmol/L (ref 3.5–5.1)
Sodium: 137 mmol/L (ref 135–145)

## 2021-04-08 LAB — HEPATIC FUNCTION PANEL
ALT: 12 U/L (ref 0–44)
AST: 19 U/L (ref 15–41)
Albumin: 3.6 g/dL (ref 3.5–5.0)
Alkaline Phosphatase: 97 U/L (ref 38–126)
Bilirubin, Direct: 0.2 mg/dL (ref 0.0–0.2)
Indirect Bilirubin: 1.3 mg/dL — ABNORMAL HIGH (ref 0.3–0.9)
Total Bilirubin: 1.5 mg/dL — ABNORMAL HIGH (ref 0.3–1.2)
Total Protein: 6.8 g/dL (ref 6.5–8.1)

## 2021-04-08 LAB — PHOSPHORUS: Phosphorus: 4.1 mg/dL (ref 2.5–4.6)

## 2021-04-08 LAB — MAGNESIUM: Magnesium: 2.1 mg/dL (ref 1.7–2.4)

## 2021-04-08 MED ORDER — MENTHOL 3 MG MT LOZG
1.0000 | LOZENGE | OROMUCOSAL | Status: DC | PRN
  Administered 2021-04-08 – 2021-04-09 (×2): 3 mg via ORAL
  Filled 2021-04-08 (×2): qty 9

## 2021-04-09 LAB — HEPATIC FUNCTION PANEL
ALT: 14 U/L (ref 0–44)
AST: 22 U/L (ref 15–41)
Albumin: 3.7 g/dL (ref 3.5–5.0)
Alkaline Phosphatase: 94 U/L (ref 38–126)
Bilirubin, Direct: 0.1 mg/dL (ref 0.0–0.2)
Indirect Bilirubin: 1.1 mg/dL — ABNORMAL HIGH (ref 0.3–0.9)
Total Bilirubin: 1.2 mg/dL (ref 0.3–1.2)
Total Protein: 6.7 g/dL (ref 6.5–8.1)

## 2021-04-09 LAB — CBC
HCT: 37.4 % (ref 36.0–46.0)
Hemoglobin: 12.3 g/dL (ref 12.0–15.0)
MCH: 26.8 pg (ref 26.0–34.0)
MCHC: 32.9 g/dL (ref 30.0–36.0)
MCV: 81.5 fL (ref 80.0–100.0)
Platelets: 200 10*3/uL (ref 150–400)
RBC: 4.59 MIL/uL (ref 3.87–5.11)
RDW: 13.8 % (ref 11.5–15.5)
WBC: 9.1 10*3/uL (ref 4.0–10.5)
nRBC: 0 % (ref 0.0–0.2)

## 2021-04-09 LAB — BASIC METABOLIC PANEL
Anion gap: 8 (ref 5–15)
BUN: 12 mg/dL (ref 6–20)
CO2: 23 mmol/L (ref 22–32)
Calcium: 9 mg/dL (ref 8.9–10.3)
Chloride: 107 mmol/L (ref 98–111)
Creatinine, Ser: 0.68 mg/dL (ref 0.44–1.00)
GFR, Estimated: >60 mL/min (ref >60)
Glucose, Bld: 105 mg/dL — ABNORMAL HIGH (ref 70–99)
Potassium: 4.1 mmol/L (ref 3.5–5.1)
Sodium: 138 mmol/L (ref 135–145)

## 2021-04-09 LAB — MAGNESIUM: Magnesium: 2 mg/dL (ref 1.7–2.4)

## 2021-04-09 LAB — PHOSPHORUS: Phosphorus: 4.6 mg/dL (ref 2.5–4.6)

## 2021-04-09 MED ORDER — AMOXICILLIN-POT CLAVULANATE 875-125 MG PO TABS
1.0000 | ORAL_TABLET | Freq: Two times a day (BID) | ORAL | Status: DC
  Administered 2021-04-09: 1 via ORAL
  Filled 2021-04-09: qty 1

## 2021-04-09 MED ORDER — AMOXICILLIN-POT CLAVULANATE 875-125 MG PO TABS: 1.0000 | ORAL_TABLET | Freq: Two times a day (BID) | ORAL | 0 refills | Status: AC

## 2021-04-09 MED ORDER — IBUPROFEN 800 MG PO TABS: 800.0000 mg | ORAL_TABLET | Freq: Three times a day (TID) | ORAL | 0 refills | Status: AC | PRN

## 2021-04-09 MED ORDER — DOCUSATE SODIUM 100 MG PO CAPS: 100.0000 mg | ORAL_CAPSULE | Freq: Two times a day (BID) | ORAL | 0 refills | Status: AC | PRN

## 2021-04-09 NOTE — Plan of Care (Signed)

## 2021-04-09 NOTE — Progress Notes (Signed)
Mobility Specialist - Progress Note   04/09/21 1400  Mobility  Activity Ambulated in room  Level of Assistance Independent  Assistive Device None  Distance Ambulated (ft) 80 ft  Mobility Ambulated independently in room  Mobility Response Tolerated well  Mobility performed by Mobility specialist  $Mobility charge 1 Mobility    Post-mobility: 89 HR, 100% SpO2   Pt ambulated in room independently. No SOB on RA. No complaints.    Filiberto Pinks Mobility Specialist 04/09/21, 2:53 PM

## 2021-04-09 NOTE — Progress Notes (Signed)
Subjective:  CC: Hannah Mora is a 40 y.o. female  Hospital stay day 1, acute chole HPI: No issues today, no pain, tolerating clears  ROS:  General: Denies weight loss, weight gain, fatigue, fevers, chills, and night sweats. Heart: Denies chest pain, palpitations, racing heart, irregular heartbeat, leg pain or swelling, and decreased activity tolerance. Respiratory: Denies breathing difficulty, shortness of breath, wheezing, cough, and sputum. GI: Denies change in appetite, heartburn, nausea, vomiting, constipation, diarrhea, and blood in stool. GU: Denies difficulty urinating, pain with urinating, urgency, frequency, blood in urine.   Objective:   VSS  Intake/Output this shift:  N/a  Constitutional :  alert, cooperative, appears stated age, and no distress  Respiratory:  clear to auscultation bilaterally  Cardiovascular:  regular rate and rhythm  Gastrointestinal: soft, non-tender; bowel sounds normal; no masses,  no organomegaly.   Skin: Cool and moist.   Psychiatric: Normal affect, non-agitated, not confused       LABS:  WNL   RADS: N/a Assessment:   Acute chole.  COVID positive. Asymptomatic for now , but will defer surgical intervention to minimize any risk.  Symptoms resolved from chole standpoint.  ADAT and continue to monitor

## 2021-04-10 NOTE — Discharge Summary (Signed)
Physician Discharge Summary  Patient ID: Hannah Mora MRN: [id number] DOB/AGE: December 14, 1980 40 y.o.  Admit date: 04/07/2021 Discharge date: 04/09/2021  Admission Diagnoses: Acute cholecystitis, COVID-positive  Discharge Diagnoses:  Same as above  Discharged Condition: good  Hospital Course: Work-up in clinic revealed concerns for acute cholecystitis, admitted pending robotic assisted lap chole.  Unfortunately patient tested positive for COVID.  Asymptomatic at the time of the admission.  Had an extensive discussion regarding risk benefits alternatives of proceeding with lap chole with a positive COVID test, including some studies citing possible increase even in asymptomatic COVID-positive individuals undergoing surgery.  Alternative is being continue with antibiotics for now, since despite the imaging findings and initial presentation of biliary colic type pain, patient was asymptomatic by the time she was admitted and continued to be asymptomatic throughout her hospital stay.  After much discussion, group consensus was to continue antibiotic treatment and postpone the surgery until she remains asymptomatic for 10 days per hospital policy to undergo lap chole.  She fortunately continued to remain asymptomatic from gallbladder standpoint despite resuming diet and her labs continue to look reassuring.  She was deemed safe to be discharged on oral antibiotics with close follow-up as needed until the surgery.  Of note, she did start developing a minor sore throat as well as some nasal discharge, concerns being this may potentially be the beginning of her symptoms of COVID.  She was instructed to call the office and proceed with scheduling the surgery, with hopes that she does not develop any further symptoms from COVID or have any new onset biliary colic in the meantime.  Consults: None  Discharge Exam: Blood pressure 97/69, pulse 80, temperature 98 F (36.7 C), temperature source Oral, resp. rate  18, height 5\' 2"  (1.575 m), weight 70 kg, SpO2 97 %. General appearance: alert, cooperative, and no distress GI: soft, non-tender; bowel sounds normal; no masses,  no organomegaly  Disposition:  Discharge disposition: 01-Home or Self Care       Discharge Instructions     Discharge patient   Complete by: As directed    Discharge disposition: 01-Home or Self Care   Discharge patient date: 04/09/2021      Allergies as of 04/09/2021   No Known Allergies      Medication List     TAKE these medications    acetaminophen 500 MG tablet Commonly known as: TYLENOL Take by mouth.   amoxicillin-clavulanate 875-125 MG tablet Commonly known as: Augmentin Take 1 tablet by mouth 2 (two) times daily for 7 days.   docusate sodium 100 MG capsule Commonly known as: Colace Take 1 capsule (100 mg total) by mouth 2 (two) times daily as needed for up to 10 days for mild constipation.   ibuprofen 800 MG tablet Commonly known as: ADVIL Take 1 tablet (800 mg total) by mouth every 8 (eight) hours as needed for mild pain or moderate pain.   Norgestimate-Ethinyl Estradiol Triphasic 0.18/0.215/0.25 MG-35 MCG tablet Take by mouth.   ondansetron 4 MG disintegrating tablet Commonly known as: Zofran ODT Take 1 tablet (4 mg total) by mouth every 8 (eight) hours as needed for nausea or vomiting.   oxyCODONE-acetaminophen 5-325 MG tablet Commonly known as: Percocet Take 1 tablet by mouth every 4 (four) hours as needed for severe pain.        Follow-up Information     Faywood, Xaine Sansom, DO. Call.   Specialty: Surgery Why: to schedule surgery as long as asymptomatic from COVID Contact information: [address]  [place] [address] [phone]                  Total time spent arranging discharge was >20min. Signed: Sung Amabile 04/10/2021, 1:51 PM

## 2021-04-15 ENCOUNTER — Ambulatory Visit: Payer: Self-pay | Admitting: Surgery

## 2021-04-15 ENCOUNTER — Other Ambulatory Visit: Payer: Self-pay

## 2021-04-15 ENCOUNTER — Other Ambulatory Visit
Admission: RE | Admit: 2021-04-15 | Discharge: 2021-04-15 | Disposition: A | Discharge status: Home / Self Care | Payer: PRIVATE HEALTH INSURANCE | Source: Ambulatory Visit | Attending: Surgery | Admitting: Surgery

## 2021-04-15 HISTORY — DX: Pneumonia, unspecified organism: J18.9

## 2021-04-15 HISTORY — DX: Other specified postprocedural states: Z98.890

## 2021-04-15 HISTORY — DX: Anemia, unspecified: D64.9

## 2021-04-15 HISTORY — DX: Other specified postprocedural states: R11.2

## 2021-04-15 NOTE — Patient Instructions (Addendum)
Your procedure is scheduled on: 04/24/21  Report to the Registration Desk on the 1st floor of the [facility]. To find out your arrival time, please call [phone] between 1PM - 3PM on: 04/23/21  REMEMBER: Instructions that are not followed completely may result in serious medical risk, up to and including death; or upon the discretion of your surgeon and anesthesiologist your surgery may need to be rescheduled.  Do not eat food after midnight the night before surgery.  No gum chewing, lozengers or hard candies.  You may however, drink CLEAR liquids up to 2 hours before you are scheduled to arrive for your surgery. Do not drink anything within 2 hours of your scheduled arrival time.  Clear liquids include: - water  - apple juice without pulp - gatorade (not RED, PURPLE, OR BLUE) - black coffee or tea (Do NOT add milk or creamers to the coffee or tea) Do NOT drink anything that is not on this list.  TAKE THESE MEDICATIONS THE MORNING OF SURGERY WITH A SIP OF WATER: None  One week prior to surgery: Stop Anti-inflammatories (NSAIDS) such as Advil, Aleve, Ibuprofen, Motrin, Naproxen, Naprosyn and Aspirin based products such as Excedrin, Goodys Powder, BC Powder.  Stop ANY OVER THE COUNTER supplements until after surgery.  You may however, continue to take Tylenol if needed for pain up until the day of surgery.  No Alcohol for 24 hours before or after surgery.  No Smoking including e-cigarettes for 24 hours prior to surgery.  No chewable tobacco products for at least 6 hours prior to surgery.  No nicotine patches on the day of surgery.  Do not use any "recreational" drugs for at least a week prior to your surgery.  Please be advised that the combination of cocaine and anesthesia may have negative outcomes, up to and including death. If you test positive for cocaine, your surgery will be cancelled.  On the morning of surgery brush your teeth with toothpaste and water, you may  rinse your mouth with mouthwash if you wish. Do not swallow any toothpaste or mouthwash.  Do not wear jewelry, make-up, hairpins, clips or nail polish.  Do not wear lotions, powders, or perfumes.   Do not shave body from the neck down 48 hours prior to surgery just in case you cut yourself which could leave a site for infection.  Also, freshly shaved skin may become irritated if using the CHG soap.  Contact lenses, hearing aids and dentures may not be worn into surgery.  Do not bring valuables to the hospital. [facility] is not responsible for any missing/lost belongings or valuables.   Use CHG Soap or wipes as directed on instruction sheet.  Notify your doctor if there is any change in your medical condition (cold, fever, infection).  Wear comfortable clothing (specific to your surgery type) to the hospital.  After surgery, you can help prevent lung complications by doing breathing exercises.  Take deep breaths and cough every 1-2 hours. Your doctor may order a device called an Incentive Spirometer to help you take deep breaths. When coughing or sneezing, hold a pillow firmly against your incision with both hands. This is called "splinting." Doing this helps protect your incision. It also decreases belly discomfort.  If you are being admitted to the hospital overnight, leave your suitcase in the car. After surgery it may be brought to your room.  If you are being discharged the day of surgery, you will not be allowed to drive home.   You will need a responsible adult (18 years or older) to drive you home and stay with you that night.   If you are taking public transportation, you will need to have a responsible adult (18 years or older) with you. Please confirm with your physician that it is acceptable to use public transportation.   Please call the Pre-admissions Testing Dept. at [phone] if you have any questions about these instructions.  Surgery Visitation  Policy:  Patients undergoing a surgery or procedure may have one family member or support person with them as long as that person is not COVID-19 positive or experiencing its symptoms.  That person may remain in the waiting area during the procedure.  Inpatient Visitation:    Visiting hours are 7 a.m. to 8 p.m. Inpatients will be allowed two visitors daily. The visitors may change each day during the patient's stay. No visitors under the age of 12. Any visitor under the age of 18 must be accompanied by an adult. The visitor must pass COVID-19 screenings, use hand sanitizer when entering and exiting the patient's room and wear a mask at all times, including in the patient's room. Patients must also wear a mask when staff or their visitor are in the room. Masking is required regardless of vaccination status.  

## 2021-04-15 NOTE — H&P (View-Only) (Signed)
CC: Acute cholecystitis [K81.0]  HPI: Hannah Mora is a 40 y.o. female who is consulted by [facility] for evaluation of above cc. Symptoms were first noted 1 day ago. Pain is sharp Associated with nausea, exacerbated by nothing specific.  Past Medical History: has a past medical history of Pneumonia.  Past Surgical History: has a past surgical history that includes Knee arthroscopy; Combined augmentation mammaplasty and abdominoplasty; and Augmentation mammaplasty (Bilateral).  Family History: family history includes Asthma in her mother; Breast cancer in her maternal grandmother; Diabetes in her mother; Diabetes type II in her mother; High blood pressure (Hypertension) in her mother; Hyperlipidemia (Elevated cholesterol) in her mother.  Social History: reports that she has never smoked. She has never used smokeless tobacco. She reports previous drug use. She reports that she does not drink alcohol.  Current Medications: has a current medication list which includes the following prescription(s): acetaminophen, ascorbic acid (vitamin c), benzoyl peroxide, cholecalciferol, multivitamin, norgest-ethinyl estradiol triphasic, omeprazole, ondansetron, and oxycodone-acetaminophen.  Allergies:  Allergies as of 04/07/2021   (No Known Allergies)   ROS:  A 15 point review of systems was performed and pertinent positives and negatives noted in HPI   Objective:    BP 135/86  Pulse 102  Ht 157.5 cm (5\' 2" )  Wt 70.8 kg (156 lb)  BMI 28.53 kg/m   Constitutional : alert, appears stated age, cooperative and no distress  Lymphatics/Throat: no asymmetry, masses, or scars  Respiratory: clear to auscultation bilaterally  Cardiovascular: regular rate and rhythm  Gastrointestinal: soft, no guarding, TTP noted in RUQ.  Musculoskeletal: Steady gait and movement  Skin: Cool and moist  Psychiatric: Normal affect, non-agitated, not confused    LABS:  wnl   RADS: CLINICAL DATA:  Right upper  quadrant and back pain   EXAM:  ULTRASOUND ABDOMEN LIMITED RIGHT UPPER QUADRANT   COMPARISON:  None.   FINDINGS:  Gallbladder:   A single large gallstone measuring up to 2.7 cm is visualized.  Sludge is present. Gallbladder wall thickening, measuring up to 5  mm. No pericholecystic fluid. Negative sonographic Murphy sign.   Common bile duct:   Diameter: 3.0   Liver:   No focal lesion identified. Within normal limits in parenchymal  echogenicity. Portal vein is patent on color Doppler imaging with  normal direction of blood flow towards the liver.   Other: None.   IMPRESSION:  Gallbladder wall thickening with a single large gallstone  visualized. Sonographic sign is negative. Findings are  equivocal for acute cholecystitis.   Electronically Signed    By: Eulah Pont MD    On: 04/07/2021 13:09  Assessment:    Acute cholecystitis [K81.0]  Plan:    1. Acute cholecystitis [K81.0] Discussed the risk of surgery including post-op infxn, seroma, biloma, chronic pain, poor-delayed wound healing, retained gallstone, conversion to open procedure, post-op SBO or ileus, and need for additional procedures to address said risks. The risks of general anesthetic including MI, CVA, sudden death or even reaction to anesthetic medications also discussed. Alternatives include continued observation. Benefits include possible symptom relief, prevention of complications including acute cholecystitis, pancreatitis.  Typical post operative recovery of 3-5 days rest, continued pain in area and incision sites, possible loose stools up to 4-6 weeks, also discussed.  ED return precautions given for sudden increase in RUQ pain, with possible accompanying fever, nausea, and/or vomiting.  The patient understands the risks, any and all questions were answered to the patient's satisfaction.  2. Patient has elected to proceed  with surgical treatment. Procedure will be scheduled. Written  consent was obtained. robotic assisted laparoscopic.

## 2021-04-15 NOTE — H&P (Signed)
CC: Acute cholecystitis [K81.0]  HPI: Hannah Mora is a 40 y.o. female who is consulted by [facility] for evaluation of above cc. Symptoms were first noted 1 day ago. Pain is sharp Associated with nausea, exacerbated by nothing specific.  Past Medical History: has a past medical history of Pneumonia.  Past Surgical History: has a past surgical history that includes Knee arthroscopy; Combined augmentation mammaplasty and abdominoplasty; and Augmentation mammaplasty (Bilateral).  Family History: family history includes Asthma in her mother; Breast cancer in her maternal grandmother; Diabetes in her mother; Diabetes type II in her mother; High blood pressure (Hypertension) in her mother; Hyperlipidemia (Elevated cholesterol) in her mother.  Social History: reports that she has never smoked. She has never used smokeless tobacco. She reports previous drug use. She reports that she does not drink alcohol.  Current Medications: has a current medication list which includes the following prescription(s): acetaminophen, ascorbic acid (vitamin c), benzoyl peroxide, cholecalciferol, multivitamin, norgest-ethinyl estradiol triphasic, omeprazole, ondansetron, and oxycodone-acetaminophen.  Allergies:  Allergies as of 04/07/2021   (No Known Allergies)   ROS:  A 15 point review of systems was performed and pertinent positives and negatives noted in HPI   Objective:    BP 135/86  Pulse 102  Ht 157.5 cm (5' 2")  Wt 70.8 kg (156 lb)  BMI 28.53 kg/m   Constitutional : alert, appears stated age, cooperative and no distress  Lymphatics/Throat: no asymmetry, masses, or scars  Respiratory: clear to auscultation bilaterally  Cardiovascular: regular rate and rhythm  Gastrointestinal: soft, no guarding, TTP noted in RUQ.  Musculoskeletal: Steady gait and movement  Skin: Cool and moist  Psychiatric: Normal affect, non-agitated, not confused    LABS:  wnl   RADS: CLINICAL DATA:  Right upper  quadrant and back pain   EXAM:  ULTRASOUND ABDOMEN LIMITED RIGHT UPPER QUADRANT   COMPARISON:  None.   FINDINGS:  Gallbladder:   A single large gallstone measuring up to 2.7 cm is visualized.  Sludge is present. Gallbladder wall thickening, measuring up to 5  mm. No pericholecystic fluid. Negative sonographic Murphy sign.   Common bile duct:   Diameter: 3.0   Liver:   No focal lesion identified. Within normal limits in parenchymal  echogenicity. Portal vein is patent on color Doppler imaging with  normal direction of blood flow towards the liver.   Other: None.   IMPRESSION:  Gallbladder wall thickening with a single large gallstone  visualized. Sonographic Murphy sign is negative. Findings are  equivocal for acute cholecystitis.   Electronically Signed    By: Leah  Strickland MD    On: 04/07/2021 13:09  Assessment:    Acute cholecystitis [K81.0]  Plan:    1. Acute cholecystitis [K81.0] Discussed the risk of surgery including post-op infxn, seroma, biloma, chronic pain, poor-delayed wound healing, retained gallstone, conversion to open procedure, post-op SBO or ileus, and need for additional procedures to address said risks. The risks of general anesthetic including MI, CVA, sudden death or even reaction to anesthetic medications also discussed. Alternatives include continued observation. Benefits include possible symptom relief, prevention of complications including acute cholecystitis, pancreatitis.  Typical post operative recovery of 3-5 days rest, continued pain in area and incision sites, possible loose stools up to 4-6 weeks, also discussed.  ED return precautions given for sudden increase in RUQ pain, with possible accompanying fever, nausea, and/or vomiting.  The patient understands the risks, any and all questions were answered to the patient's satisfaction.  2. Patient has elected to proceed   with surgical treatment. Procedure will be scheduled. Written  consent was obtained. robotic assisted laparoscopic.

## 2021-04-23 MED ORDER — INDOCYANINE GREEN 25 MG IV SOLR
1.2500 mg | Freq: Once | INTRAVENOUS | Status: AC
  Administered 2021-04-24: 1.25 mg via INTRAVENOUS
  Filled 2021-04-23: qty 10
  Filled 2021-04-23: qty 0.5

## 2021-04-23 MED ORDER — CEFAZOLIN SODIUM-DEXTROSE 2-4 GM/100ML-% IV SOLN
2.0000 g | INTRAVENOUS | Status: AC
  Administered 2021-04-24: 2 g via INTRAVENOUS

## 2021-04-24 ENCOUNTER — Ambulatory Visit: Payer: BC Managed Care – PPO | Admitting: Anesthesiology

## 2021-04-24 ENCOUNTER — Ambulatory Visit
Admission: RE | Admit: 2021-04-24 | Discharge: 2021-04-24 | Disposition: A | Discharge status: Home / Self Care | Payer: BC Managed Care – PPO | Attending: Surgery | Admitting: Surgery

## 2021-04-24 ENCOUNTER — Other Ambulatory Visit: Payer: Self-pay

## 2021-04-24 ENCOUNTER — Encounter
Admission: RE | Disposition: A | Discharge status: Home / Self Care | Payer: Self-pay | Source: Home / Self Care | Attending: Surgery

## 2021-04-24 ENCOUNTER — Encounter: Payer: Self-pay | Admitting: Surgery

## 2021-04-24 DIAGNOSIS — K8012 Calculus of gallbladder with acute and chronic cholecystitis without obstruction: Secondary | ICD-10-CM | POA: Diagnosis present

## 2021-04-24 DIAGNOSIS — K810 Acute cholecystitis: Secondary | ICD-10-CM

## 2021-04-24 LAB — POCT PREGNANCY, URINE: Preg Test, Ur: NEGATIVE

## 2021-04-24 SURGERY — CHOLECYSTECTOMY, ROBOT-ASSISTED, LAPAROSCOPIC
Anesthesia: General | Site: Abdomen

## 2021-04-24 MED ORDER — OXYCODONE HCL 5 MG PO TABS: 5.0000 mg | ORAL_TABLET | Freq: Once | ORAL | Status: DC | PRN

## 2021-04-24 MED ORDER — LIDOCAINE HCL (PF) 1 % IJ SOLN
INTRAMUSCULAR | Status: AC
  Filled 2021-04-24: qty 30

## 2021-04-24 MED ORDER — SUGAMMADEX SODIUM 200 MG/2ML IV SOLN
INTRAVENOUS | Status: DC | PRN
Start: 2021-04-24 — End: 2021-04-24
  Administered 2021-04-24: 200 mg via INTRAVENOUS

## 2021-04-24 MED ORDER — PROPOFOL 500 MG/50ML IV EMUL
INTRAVENOUS | Status: DC | PRN
  Administered 2021-04-24: 150 ug/kg/min via INTRAVENOUS

## 2021-04-24 MED ORDER — APREPITANT 40 MG PO CAPS
40.0000 mg | ORAL_CAPSULE | Freq: Once | ORAL | Status: AC
  Administered 2021-04-24: 40 mg via ORAL

## 2021-04-24 MED ORDER — CELECOXIB 200 MG PO CAPS
ORAL_CAPSULE | ORAL | Status: AC
  Administered 2021-04-24: 200 mg via ORAL
  Filled 2021-04-24: qty 1

## 2021-04-24 MED ORDER — DEXAMETHASONE SODIUM PHOSPHATE 10 MG/ML IJ SOLN
INTRAMUSCULAR | Status: AC
  Filled 2021-04-24: qty 1

## 2021-04-24 MED ORDER — ORAL CARE MOUTH RINSE: 15.0000 mL | Freq: Once | OROMUCOSAL | Status: AC

## 2021-04-24 MED ORDER — CEFAZOLIN SODIUM-DEXTROSE 2-4 GM/100ML-% IV SOLN
INTRAVENOUS | Status: AC
  Filled 2021-04-24: qty 100

## 2021-04-24 MED ORDER — MEPERIDINE HCL 25 MG/ML IJ SOLN: 6.2500 mg | INTRAMUSCULAR | Status: DC | PRN

## 2021-04-24 MED ORDER — DOCUSATE SODIUM 100 MG PO CAPS: 100.0000 mg | ORAL_CAPSULE | Freq: Two times a day (BID) | ORAL | 0 refills | Status: AC | PRN

## 2021-04-24 MED ORDER — HYDROCODONE-ACETAMINOPHEN 5-325 MG PO TABS: 1.0000 | ORAL_TABLET | Freq: Four times a day (QID) | ORAL | 0 refills | Status: AC | PRN

## 2021-04-24 MED ORDER — DEXAMETHASONE SODIUM PHOSPHATE 10 MG/ML IJ SOLN
INTRAMUSCULAR | Status: DC | PRN
  Administered 2021-04-24: 10 mg via INTRAVENOUS

## 2021-04-24 MED ORDER — GABAPENTIN 300 MG PO CAPS: 300.0000 mg | ORAL_CAPSULE | ORAL | Status: AC

## 2021-04-24 MED ORDER — CELECOXIB 200 MG PO CAPS: 200.0000 mg | ORAL_CAPSULE | ORAL | Status: AC

## 2021-04-24 MED ORDER — PROMETHAZINE HCL 25 MG/ML IJ SOLN
INTRAMUSCULAR | Status: AC
  Filled 2021-04-24: qty 1

## 2021-04-24 MED ORDER — ONDANSETRON HCL 4 MG/2ML IJ SOLN
INTRAMUSCULAR | Status: DC | PRN
Start: 2021-04-24 — End: 2021-04-24
  Administered 2021-04-24: 4 mg via INTRAVENOUS

## 2021-04-24 MED ORDER — KETAMINE HCL 50 MG/5ML IJ SOSY
PREFILLED_SYRINGE | INTRAMUSCULAR | Status: AC
  Filled 2021-04-24: qty 5

## 2021-04-24 MED ORDER — SODIUM CHLORIDE FLUSH 0.9 % IV SOLN
INTRAVENOUS | Status: AC
  Filled 2021-04-24: qty 10

## 2021-04-24 MED ORDER — CHLORHEXIDINE GLUCONATE 0.12 % MT SOLN
OROMUCOSAL | Status: AC
  Administered 2021-04-24: 15 mL via OROMUCOSAL
  Filled 2021-04-24: qty 15

## 2021-04-24 MED ORDER — PROMETHAZINE HCL 25 MG/ML IJ SOLN
6.2500 mg | INTRAMUSCULAR | Status: DC | PRN
Start: 2021-04-24 — End: 2021-04-25

## 2021-04-24 MED ORDER — ROCURONIUM BROMIDE 10 MG/ML (PF) SYRINGE
PREFILLED_SYRINGE | INTRAVENOUS | Status: AC
  Filled 2021-04-24: qty 10

## 2021-04-24 MED ORDER — PROPOFOL 500 MG/50ML IV EMUL
INTRAVENOUS | Status: AC
  Filled 2021-04-24: qty 50

## 2021-04-24 MED ORDER — ROCURONIUM BROMIDE 100 MG/10ML IV SOLN
INTRAVENOUS | Status: DC | PRN
  Administered 2021-04-24: 10 mg via INTRAVENOUS
  Administered 2021-04-24: 50 mg via INTRAVENOUS

## 2021-04-24 MED ORDER — CHLORHEXIDINE GLUCONATE CLOTH 2 % EX PADS
6.0000 | MEDICATED_PAD | Freq: Once | CUTANEOUS | Status: AC
  Administered 2021-04-24: 6 via TOPICAL

## 2021-04-24 MED ORDER — ACETAMINOPHEN 10 MG/ML IV SOLN: 1000.0000 mg | Freq: Once | INTRAVENOUS | Status: DC | PRN

## 2021-04-24 MED ORDER — OXYCODONE HCL 5 MG/5ML PO SOLN: 5.0000 mg | Freq: Once | ORAL | Status: DC | PRN

## 2021-04-24 MED ORDER — FENTANYL CITRATE (PF) 100 MCG/2ML IJ SOLN
INTRAMUSCULAR | Status: AC
  Administered 2021-04-24: 50 ug via INTRAVENOUS
  Filled 2021-04-24: qty 2

## 2021-04-24 MED ORDER — MIDAZOLAM HCL 2 MG/2ML IJ SOLN
INTRAMUSCULAR | Status: AC
  Filled 2021-04-24: qty 2

## 2021-04-24 MED ORDER — FENTANYL CITRATE (PF) 100 MCG/2ML IJ SOLN
INTRAMUSCULAR | Status: DC | PRN
  Administered 2021-04-24 (×2): 50 ug via INTRAVENOUS

## 2021-04-24 MED ORDER — DIPHENHYDRAMINE HCL 50 MG/ML IJ SOLN
INTRAMUSCULAR | Status: DC | PRN
  Administered 2021-04-24: 12.5 mg via INTRAVENOUS

## 2021-04-24 MED ORDER — LACTATED RINGERS IV SOLN: INTRAVENOUS | Status: DC

## 2021-04-24 MED ORDER — CHLORHEXIDINE GLUCONATE 0.12 % MT SOLN: 15.0000 mL | Freq: Once | OROMUCOSAL | Status: AC

## 2021-04-24 MED ORDER — APREPITANT 40 MG PO CAPS
ORAL_CAPSULE | ORAL | Status: AC
  Filled 2021-04-24: qty 1

## 2021-04-24 MED ORDER — BUPIVACAINE-EPINEPHRINE (PF) 0.5% -1:200000 IJ SOLN
INTRAMUSCULAR | Status: AC
  Filled 2021-04-24: qty 30

## 2021-04-24 MED ORDER — PROPOFOL 10 MG/ML IV BOLUS
INTRAVENOUS | Status: DC | PRN
Start: 2021-04-24 — End: 2021-04-24
  Administered 2021-04-24: 150 mg via INTRAVENOUS
  Administered 2021-04-24: 30 mg via INTRAVENOUS
  Administered 2021-04-24: 50 mg via INTRAVENOUS

## 2021-04-24 MED ORDER — 0.9 % SODIUM CHLORIDE (POUR BTL) OPTIME
TOPICAL | Status: DC | PRN
  Administered 2021-04-24: 100 mL

## 2021-04-24 MED ORDER — LIDOCAINE HCL (CARDIAC) PF 100 MG/5ML IV SOSY
PREFILLED_SYRINGE | INTRAVENOUS | Status: DC | PRN
  Administered 2021-04-24: 50 mg via INTRAVENOUS

## 2021-04-24 MED ORDER — FENTANYL CITRATE (PF) 100 MCG/2ML IJ SOLN
INTRAMUSCULAR | Status: AC
  Filled 2021-04-24: qty 2

## 2021-04-24 MED ORDER — LIDOCAINE-EPINEPHRINE (PF) 1 %-1:200000 IJ SOLN
INTRAMUSCULAR | Status: DC | PRN
  Administered 2021-04-24: 20 mL via INTRAMUSCULAR

## 2021-04-24 MED ORDER — DROPERIDOL 2.5 MG/ML IJ SOLN
0.6250 mg | Freq: Once | INTRAMUSCULAR | Status: DC | PRN
  Filled 2021-04-24: qty 2

## 2021-04-24 MED ORDER — FENTANYL CITRATE (PF) 100 MCG/2ML IJ SOLN
25.0000 ug | INTRAMUSCULAR | Status: DC | PRN
  Administered 2021-04-24: 50 ug via INTRAVENOUS

## 2021-04-24 MED ORDER — ONDANSETRON HCL 4 MG/2ML IJ SOLN
INTRAMUSCULAR | Status: AC
  Filled 2021-04-24: qty 2

## 2021-04-24 MED ORDER — GABAPENTIN 300 MG PO CAPS
ORAL_CAPSULE | ORAL | Status: AC
  Administered 2021-04-24: 300 mg via ORAL
  Filled 2021-04-24: qty 1

## 2021-04-24 MED ORDER — DIPHENHYDRAMINE HCL 50 MG/ML IJ SOLN
INTRAMUSCULAR | Status: AC
  Filled 2021-04-24: qty 1

## 2021-04-24 MED ORDER — MIDAZOLAM HCL 2 MG/2ML IJ SOLN
INTRAMUSCULAR | Status: DC | PRN
  Administered 2021-04-24: 2 mg via INTRAVENOUS

## 2021-04-24 MED ORDER — ARTIFICIAL TEARS OPHTHALMIC OINT
TOPICAL_OINTMENT | OPHTHALMIC | Status: AC
  Filled 2021-04-24: qty 3.5

## 2021-04-24 SURGICAL SUPPLY — 56 items
ADH SKN CLS APL DERMABOND .7 (GAUZE/BANDAGES/DRESSINGS) ×1
ANCHOR TIS RET SYS 235ML (MISCELLANEOUS) ×2 IMPLANT
APL PRP STRL LF DISP 70% ISPRP (MISCELLANEOUS) ×1
BAG INFUSER PRESSURE 100CC (MISCELLANEOUS) ×2 IMPLANT
BAG TISS RTRVL C235 10X14 (MISCELLANEOUS) ×1
BLADE SURG SZ11 CARB STEEL (BLADE) ×2 IMPLANT
CANISTER SUCT 1200ML W/VALVE (MISCELLANEOUS) ×2 IMPLANT
CANNULA REDUC XI 12-8 STAPL (CANNULA) ×1
CANNULA REDUCER 12-8 DVNC XI (CANNULA) ×1 IMPLANT
CATH REDDICK CHOLANGI 4FR 50CM (CATHETERS) IMPLANT
CHLORAPREP W/TINT 26 (MISCELLANEOUS) ×2 IMPLANT
CLIP VESOLOCK MED LG 6/CT (CLIP) ×2 IMPLANT
COVER TIP SHEARS 8 DVNC (MISCELLANEOUS) IMPLANT
COVER TIP SHEARS 8MM DA VINCI (MISCELLANEOUS)
DEFOGGER SCOPE WARMER CLEARIFY (MISCELLANEOUS) ×2 IMPLANT
DERMABOND ADVANCED (GAUZE/BANDAGES/DRESSINGS) ×1
DERMABOND ADVANCED .7 DNX12 (GAUZE/BANDAGES/DRESSINGS) ×1 IMPLANT
DRAPE ARM DVNC X/XI (DISPOSABLE) ×4 IMPLANT
DRAPE C-ARM XRAY 36X54 (DRAPES) IMPLANT
DRAPE COLUMN DVNC XI (DISPOSABLE) ×1 IMPLANT
DRAPE DA VINCI XI ARM (DISPOSABLE) ×4
DRAPE DA VINCI XI COLUMN (DISPOSABLE) ×1
ELECT CAUTERY BLADE 6.4 (BLADE) ×2 IMPLANT
ELECT REM PT RETURN 9FT ADLT (ELECTROSURGICAL) ×2
ELECTRODE REM PT RTRN 9FT ADLT (ELECTROSURGICAL) ×1 IMPLANT
GAUZE 4X4 16PLY ~~LOC~~+RFID DBL (SPONGE) ×2 IMPLANT
GLOVE SURG SYN 6.5 ES PF (GLOVE) ×4 IMPLANT
GLOVE SURG UNDER POLY LF SZ7 (GLOVE) ×4 IMPLANT
GOWN STRL REUS W/ TWL LRG LVL3 (GOWN DISPOSABLE) ×3 IMPLANT
GOWN STRL REUS W/TWL LRG LVL3 (GOWN DISPOSABLE) ×6
GRASPER SUT TROCAR 14GX15 (MISCELLANEOUS) IMPLANT
IRRIGATOR SUCT 8 DISP DVNC XI (IRRIGATION / IRRIGATOR) ×1 IMPLANT
IRRIGATOR SUCTION 8MM XI DISP (IRRIGATION / IRRIGATOR) ×1
IV NS 1000ML (IV SOLUTION) ×2
IV NS 1000ML BAXH (IV SOLUTION) ×1 IMPLANT
LABEL OR SOLS (LABEL) ×2 IMPLANT
MANIFOLD NEPTUNE II (INSTRUMENTS) IMPLANT
NEEDLE HYPO 22GX1.5 SAFETY (NEEDLE) ×2 IMPLANT
NEEDLE INSUFFLATION 14GA 120MM (NEEDLE) ×2 IMPLANT
NS IRRIG 500ML POUR BTL (IV SOLUTION) ×2 IMPLANT
OBTURATOR OPTICAL STANDARD 8MM (TROCAR) ×1
OBTURATOR OPTICAL STND 8 DVNC (TROCAR) ×1
OBTURATOR OPTICALSTD 8 DVNC (TROCAR) ×1 IMPLANT
PACK LAP CHOLECYSTECTOMY (MISCELLANEOUS) ×2 IMPLANT
PENCIL ELECTRO HAND CTR (MISCELLANEOUS) ×2 IMPLANT
SEAL CANN UNIV 5-8 DVNC XI (MISCELLANEOUS) ×3 IMPLANT
SEAL XI 5MM-8MM UNIVERSAL (MISCELLANEOUS) ×3
SET TUBE SMOKE EVAC HIGH FLOW (TUBING) ×2 IMPLANT
SOLUTION ELECTROLUBE (MISCELLANEOUS) ×2 IMPLANT
STAPLER CANNULA SEAL DVNC XI (STAPLE) ×1 IMPLANT
STAPLER CANNULA SEAL XI (STAPLE) ×1
SUT MNCRL 4-0 (SUTURE) ×4
SUT MNCRL 4-0 27XMFL (SUTURE) ×2
SUT VICRYL 0 AB UR-6 (SUTURE) ×2 IMPLANT
SUTURE MNCRL 4-0 27XMF (SUTURE) ×2 IMPLANT
SYR 30ML LL (SYRINGE) IMPLANT

## 2021-04-24 NOTE — Interval H&P Note (Signed)
History and Physical Interval Note:  04/24/2021 3:24 PM  Hannah Mora  has presented today for surgery, with the diagnosis of K81.0 Acute cholecystitis.  The various methods of treatment have been discussed with the patient and family. After consideration of risks, benefits and other options for treatment, the patient has consented to  Procedure(s): XI ROBOTIC ASSISTED LAPAROSCOPIC CHOLECYSTECTOMY (N/A) as a surgical intervention.  The patient's history has been reviewed, patient examined, no change in status, stable for surgery.  I have reviewed the patient's chart and labs.  Questions were answered to the patient's satisfaction.     Davon Folta Tonna Boehringer

## 2021-04-24 NOTE — Anesthesia Postprocedure Evaluation (Signed)
Anesthesia Post Note  Patient: Juliahna Wiswell  Procedure(s) Performed: XI ROBOTIC ASSISTED LAPAROSCOPIC CHOLECYSTECTOMY (Abdomen)  Patient location during evaluation: PACU Anesthesia Type: General Level of consciousness: awake and alert Pain management: pain level controlled Vital Signs Assessment: post-procedure vital signs reviewed and stable Respiratory status: spontaneous breathing, nonlabored ventilation and respiratory function stable Cardiovascular status: blood pressure returned to baseline and stable Postop Assessment: no apparent nausea or vomiting Anesthetic complications: no   No notable events documented.   Last Vitals:  Vitals:   04/24/21 1815 04/24/21 1823  BP: 104/73 104/67  Pulse: 62 94  Resp: 14 18  Temp: 36.6 C (!) 36.2 C  SpO2: 100% 95%    Last Pain:  Vitals:   04/24/21 1823  TempSrc: Temporal  PainSc: 0-No pain                 Foye Deer

## 2021-04-24 NOTE — Op Note (Signed)
Preoperative diagnosis:  chronic and cholecystitis  Postoperative diagnosis: same as above  Procedure: Robotic assisted Laparoscopic Cholecystectomy.   Anesthesia: GETA   Surgeon: Kiarrah Rausch  Specimen: Gallbladder  Complications: None  EBL: 15mL  Wound Classification: Clean Contaminated  Indications: see HPI  Findings: Critical view of safety noted Cystic duct and artery identified, ligated and divided, clips remained intact at end of procedure Adequate hemostasis  Description of procedure:  The patient was placed on the operating table in the supine position. SCDs placed, pre-op abx administered.  General anesthesia was induced and OG tube placed by anesthesia. A time-out was completed verifying correct patient, procedure, site, positioning, and implant(s) and/or special equipment prior to beginning this procedure. The abdomen was prepped and draped in the usual sterile fashion.    Veress needle was placed at the Palmer's point and insufflation was started after confirming a positive saline drop test and no immediate increase in abdominal pressure.  After reaching 15 mm, the Veress needle was removed and a 8 mm port was placed via optiview technique under umbilicus measured 20mm from gallbladder.  The abdomen was inspected and no abnormalities or injuries were found.  Under direct vision, ports were placed in the following locations: One 12 mm patient left of the umbilicus, 8cm from the optiviewed port, one 8 mm port placed to the patient right of the umbilical port 8 cm apart.  1 additional 8 mm port placed lateral to the 12mm port.  Once ports were placed, The table was placed in the reverse Trendelenburg position with the right side up. The Xi platform was brought into the operative field and docked to the ports successfully.  An endoscope was placed through the umbilical port, fenestrated grasper through the adjacent patient right port, prograsp to the far patient left port, and  then a hook cautery in the left port.  The dome of the gallbladder was grasped with prograsp, passed and retracted over the dome of the liver. Adhesions between the gallbladder and omentum, duodenum and transverse colon were lysed via hook cautery. The infundibulum was grasped with the fenestrated grasper and retracted toward the right lower quadrant. This maneuver exposed Calot's triangle. The peritoneum overlying the gallbladder infundibulum was then dissected  and the cystic duct and cystic artery identified.  Critical view of safety with the liver bed clearly visible behind the duct and artery with no additional structures noted.  The cystic duct and cystic artery clipped and divided close to the gallbladder.     The gallbladder was then dissected from its peritoneal and liver bed attachments by electrocautery. Hemostasis was checked prior to removing the hook cautery and the Endo Catch bag was then placed through the 12 mm port and the gallbladder was removed.  The gallbladder was passed off the table as a specimen. There was no evidence of bleeding from the gallbladder fossa or cystic artery or leakage of the bile from the cystic duct stump. The 12 mm port site closed with PMI using 0 vicryl under direct vision.  Abdomen desufflated and secondary trocars were removed under direct vision. No bleeding was noted. All skin incisions then closed with subcuticular sutures of 4-0 monocryl and dressed with topical skin adhesive. The orogastric tube was removed and patient extubated.  The patient tolerated the procedure well and was taken to the postanesthesia care unit in stable condition.  All sponge and instrument count correct at end of procedure.  

## 2021-04-24 NOTE — Anesthesia Procedure Notes (Signed)
Procedure Name: Intubation Date/Time: 04/24/2021 3:50 PM Performed by: Omer Jack, CRNA Pre-anesthesia Checklist: Patient identified, Patient being monitored, Timeout performed, Emergency Drugs available and Suction available Patient Re-evaluated:Patient Re-evaluated prior to induction Oxygen Delivery Method: Circle system utilized Preoxygenation: Pre-oxygenation with 100% oxygen Induction Type: IV induction Ventilation: Mask ventilation without difficulty Laryngoscope Size: 3 and McGraph Grade View: Grade I Tube type: Oral Tube size: 7.0 mm Number of attempts: 1 Airway Equipment and Method: Stylet Placement Confirmation: ETT inserted through vocal cords under direct vision, positive ETCO2 and breath sounds checked- equal and bilateral Secured at: 21 cm Tube secured with: Tape Dental Injury: Teeth and Oropharynx as per pre-operative assessment

## 2021-04-24 NOTE — Anesthesia Preprocedure Evaluation (Addendum)
Anesthesia Evaluation  Patient identified by MRN, date of birth, ID band Patient awake    Reviewed: Allergy & Precautions, NPO status , Patient's Chart, lab work & pertinent test results  History of Anesthesia Complications (+) PONV and history of anesthetic complications  Airway Mallampati: III  TM Distance: >3 FB Neck ROM: full    Dental  (+) Teeth Intact   Pulmonary neg pulmonary ROS,    Pulmonary exam normal        Cardiovascular negative cardio ROS Normal cardiovascular exam     Neuro/Psych negative neurological ROS  negative psych ROS   GI/Hepatic negative GI ROS, Neg liver ROS,   Endo/Other  negative endocrine ROS  Renal/GU      Musculoskeletal   Abdominal Normal abdominal exam  (+)   Peds  Hematology negative hematology ROS (+)   Anesthesia Other Findings Acute cholecystitis  Reproductive/Obstetrics negative OB ROS                            Anesthesia Physical Anesthesia Plan  ASA: 2  Anesthesia Plan: General ETT   Post-op Pain Management:    Induction: Intravenous  PONV Risk Score and Plan: 4 or greater and Ondansetron, Dexamethasone, Midazolam and Propofol infusion  Airway Management Planned: Oral ETT  Additional Equipment:   Intra-op Plan:   Post-operative Plan: Extubation in OR  Informed Consent: I have reviewed the patients History and Physical, chart, labs and discussed the procedure including the risks, benefits and alternatives for the proposed anesthesia with the patient or authorized representative who has indicated his/her understanding and acceptance.     Dental Advisory Given  Plan Discussed with: Anesthesiologist, CRNA and Surgeon  Anesthesia Plan Comments:         Anesthesia Quick Evaluation

## 2021-04-24 NOTE — Transfer of Care (Signed)
Immediate Anesthesia Transfer of Care Note  Patient: Hannah Mora  Procedure(s) Performed: XI ROBOTIC ASSISTED LAPAROSCOPIC CHOLECYSTECTOMY (Abdomen)  Patient Location: PACU  Anesthesia Type:General  Level of Consciousness: drowsy  Airway & Oxygen Therapy: Patient Spontanous Breathing and Patient connected to face mask oxygen  Post-op Assessment: Report given to RN and Post -op Vital signs reviewed and stable  Post vital signs: Reviewed and stable  Last Vitals:  Vitals Value Taken Time  BP 131/90 04/24/21 1719  Temp 36.2 C 04/24/21 1719  Pulse 25 04/24/21 1722  Resp 13 04/24/21 1723  SpO2 89 % 04/24/21 1722  Vitals shown include unvalidated device data.  Last Pain:  Vitals:   04/24/21 1719  TempSrc:   PainSc: Asleep         Complications: No notable events documented.

## 2021-04-24 NOTE — Discharge Instructions (Addendum)
Laparoscopic Cholecystectomy, Care After This sheet gives you information about how to care for yourself after your procedure. Your doctor may also give you more specific instructions. If you have problems or questions, contact your doctor. Follow these instructions at home: Care for cuts from surgery (incisions)  Follow instructions from your doctor about how to take care of your cuts from surgery. Make sure you: Wash your hands with soap and water before you change your bandage (dressing). If you cannot use soap and water, use hand sanitizer. Change your bandage as told by your doctor. Leave stitches (sutures), skin glue, or skin tape (adhesive) strips in place. They may need to stay in place for 2 weeks or longer. If tape strips get loose and curl up, you may trim the loose edges. Do not remove tape strips completely unless your doctor says it is okay. Do not take baths, swim, or use a hot tub until your doctor says it is okay. OK TO SHOWER 24HRS AFTER YOUR SURGERY.  Check your surgical cut area every day for signs of infection. Check for: More redness, swelling, or pain. More fluid or blood. Warmth. Pus or a bad smell. Activity Do not drive or use heavy machinery while taking prescription pain medicine. Do not play contact sports until your doctor says it is okay. Do not drive for 24 hours if you were given a medicine to help you relax (sedative). Rest as needed. Do not return to work or school until your doctor says it is okay. General instructions  tylenol and advil as needed for discomfort.  Please alternate between the two every four hours as needed for pain.    Use narcotics, if prescribed, only when tylenol and motrin is not enough to control pain.  325-650mg every 8hrs to max of 3000mg/24hrs (including the 325mg in every norco dose) for the tylenol.    Advil up to 800mg per dose every 8hrs as needed for pain.   To prevent or treat constipation while you are taking prescription  pain medicine, your doctor may recommend that you: Drink enough fluid to keep your pee (urine) clear or pale yellow. Take over-the-counter or prescription medicines. Eat foods that are high in fiber, such as fresh fruits and vegetables, whole grains, and beans. Limit foods that are high in fat and processed sugars, such as fried and sweet foods. Contact a doctor if: You develop a rash. You have more redness, swelling, or pain around your surgical cuts. You have more fluid or blood coming from your surgical cuts. Your surgical cuts feel warm to the touch. You have pus or a bad smell coming from your surgical cuts. You have a fever. One or more of your surgical cuts breaks open. Get help right away if: You have trouble breathing. You have chest pain. You have pain that is getting worse in your shoulders. You faint or feel dizzy when you stand. You have very bad pain in your belly (abdomen). You are sick to your stomach (nauseous) for more than one day. You have throwing up (vomiting) that lasts for more than one day. You have leg pain. This information is not intended to replace advice given to you by your health care provider. Make sure you discuss any questions you have with your health care provider. Document Released: 06/01/2008 Document Revised: 03/13/2016 Document Reviewed: 02/09/2016 [employer] Interactive Patient Education  2019 [employer]  AMBULATORY SURGERY  DISCHARGE INSTRUCTIONS   The drugs that you were given will stay in your   system until tomorrow so for the next 24 hours you should not:  Drive an automobile Make any legal decisions Drink any alcoholic beverage   You may resume regular meals tomorrow.  Today it is better to start with liquids and gradually work up to solid foods.  You may eat anything you prefer, but it is better to start with liquids, then soup and crackers, and gradually work up to solid foods.   Please notify your doctor immediately if you  have any unusual bleeding, trouble breathing, redness and pain at the surgery site, drainage, fever, or pain not relieved by medication.    Additional Instructions:   Please contact your physician with any problems or Same Day Surgery at [phone], Monday through Friday 6 am to 4 pm, or [facility] number at [phone].  

## 2021-04-28 LAB — SURGICAL PATHOLOGY

## 2022-01-02 IMAGING — US US ABDOMEN LIMITED
1 series · 14 of 25 positions shown · non-contrast
Comparison: None.

CLINICAL DATA: Right upper quadrant and back pain

EXAM:
ULTRASOUND ABDOMEN LIMITED RIGHT UPPER QUADRANT

[Series 1: us abdomen limited ruq (liver/gb) · 50 acquisitions, 14 frames shown]
[im 1/50]
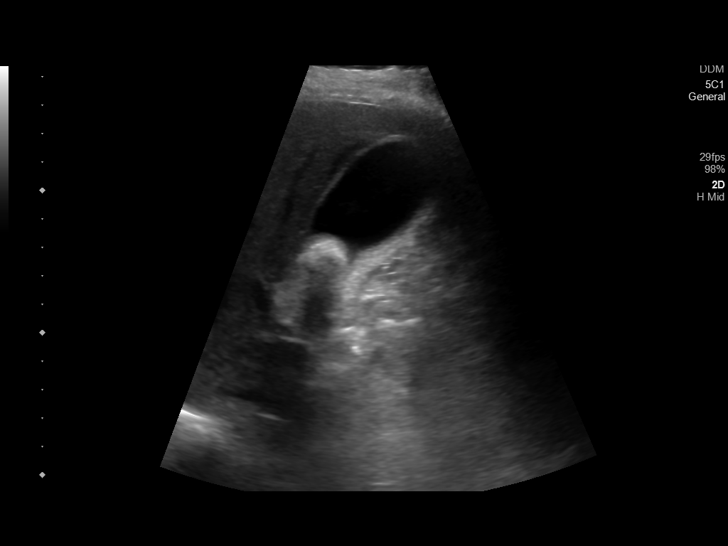
[im 5/50]
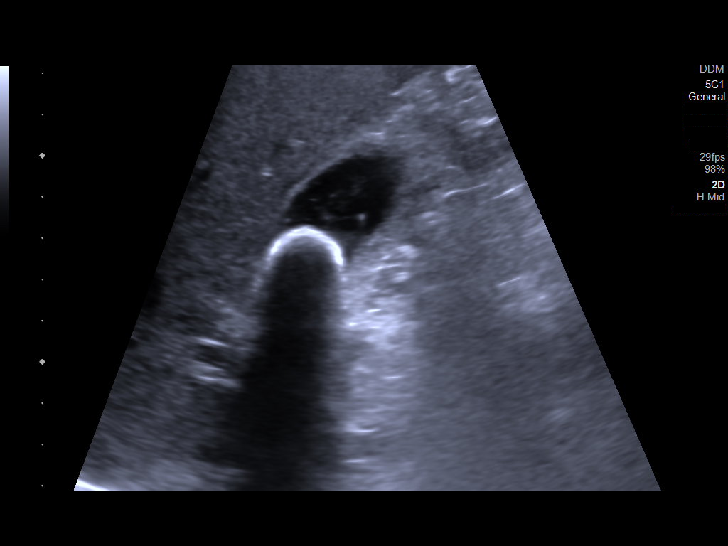
[im 9/50]
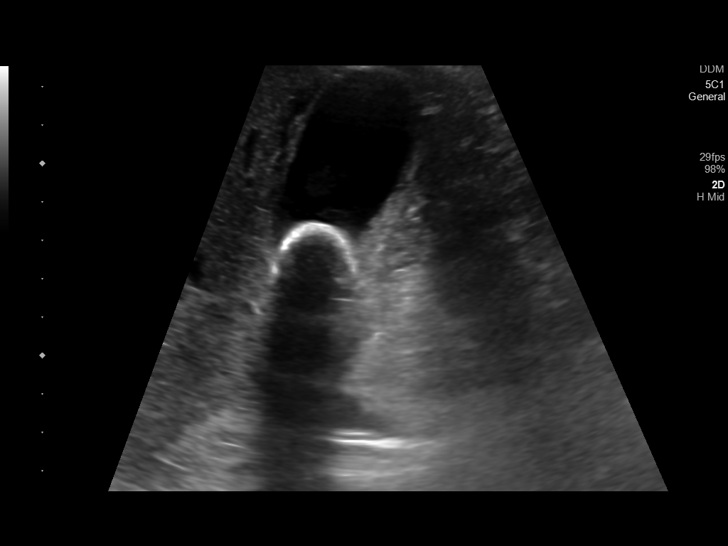
[im 13/50]
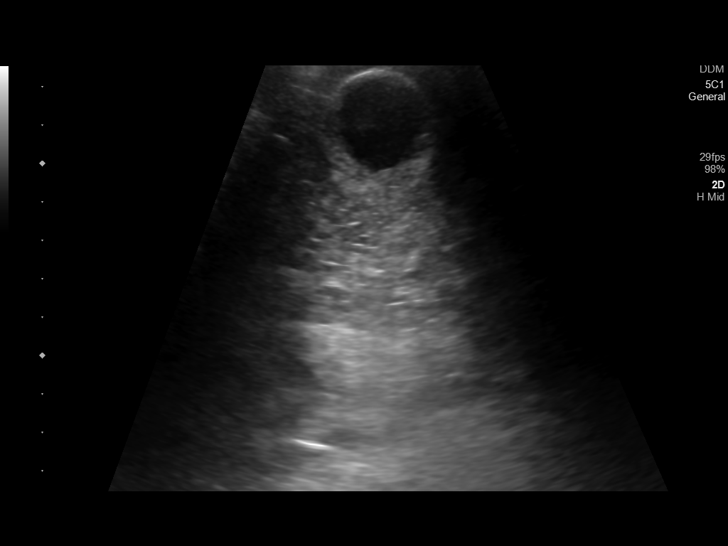
[im 17/50]
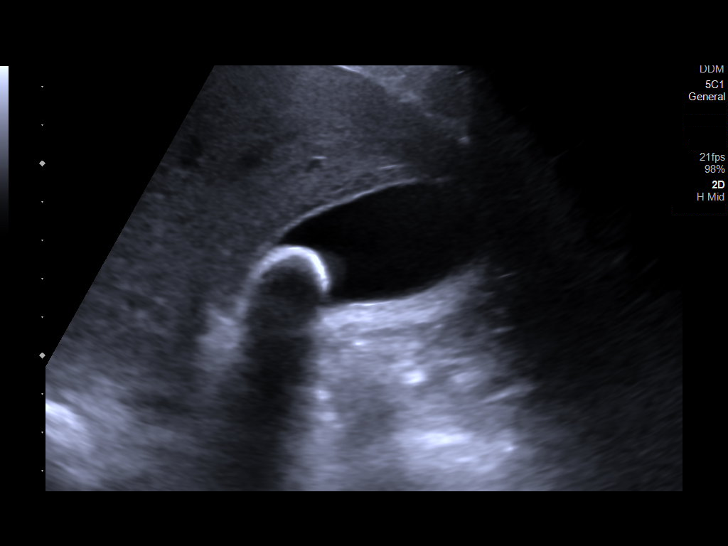
[im 19/50]
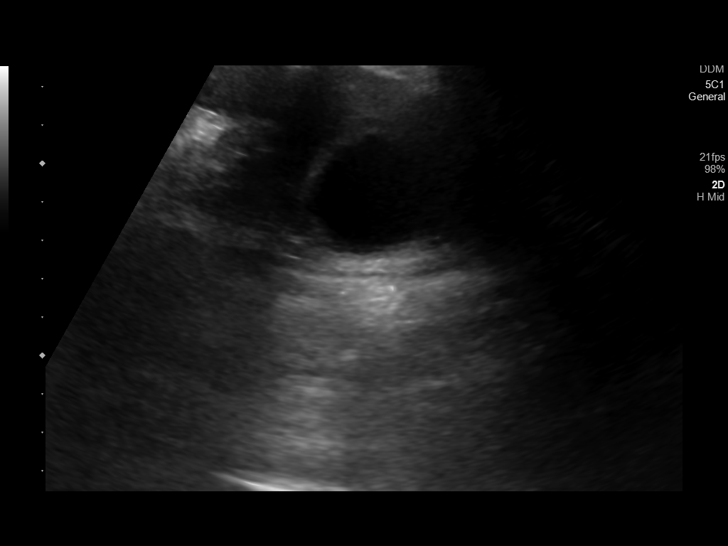
[im 23/50]
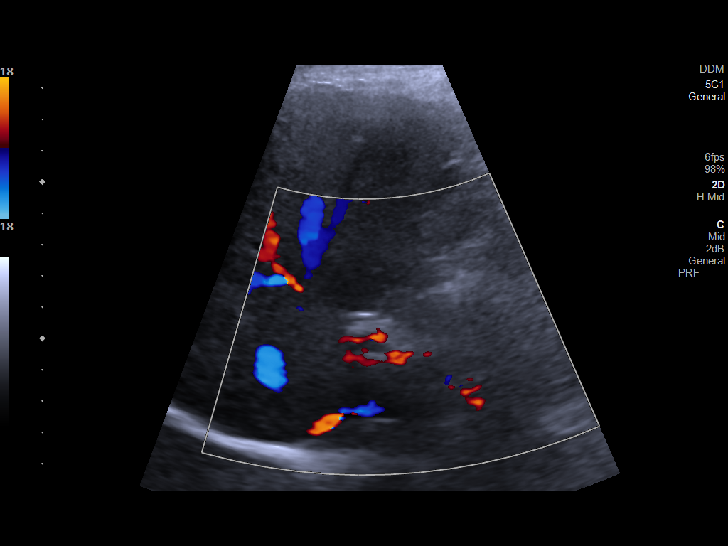
[im 27/50]
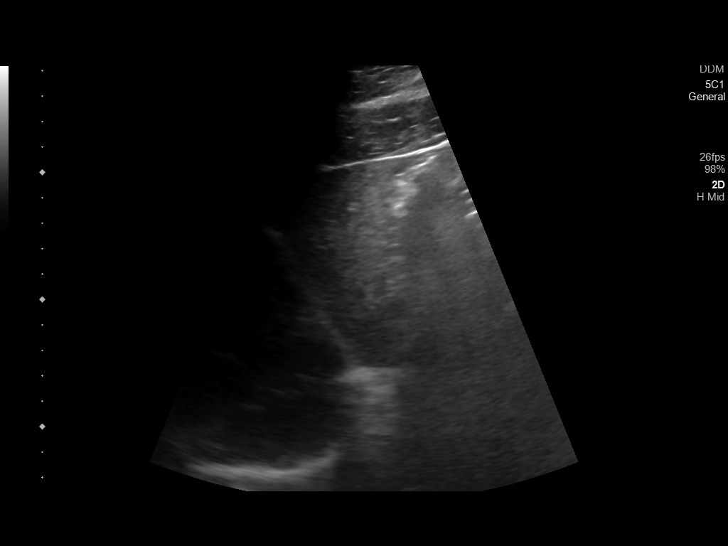
[im 31/50]
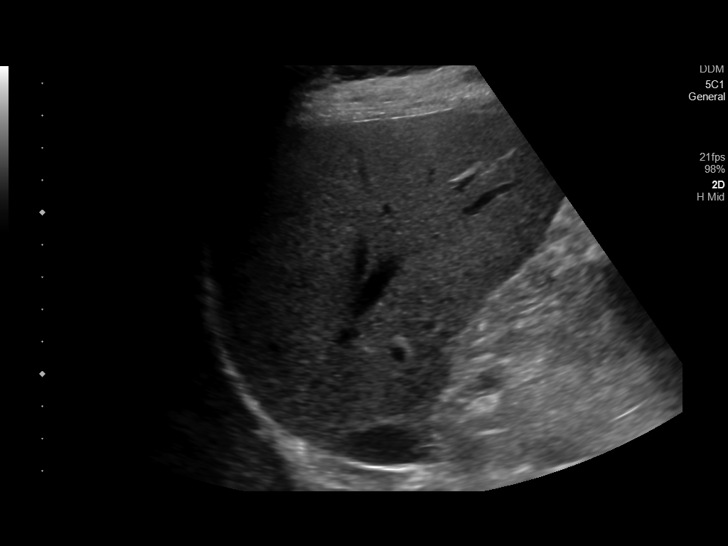
[im 33/50]
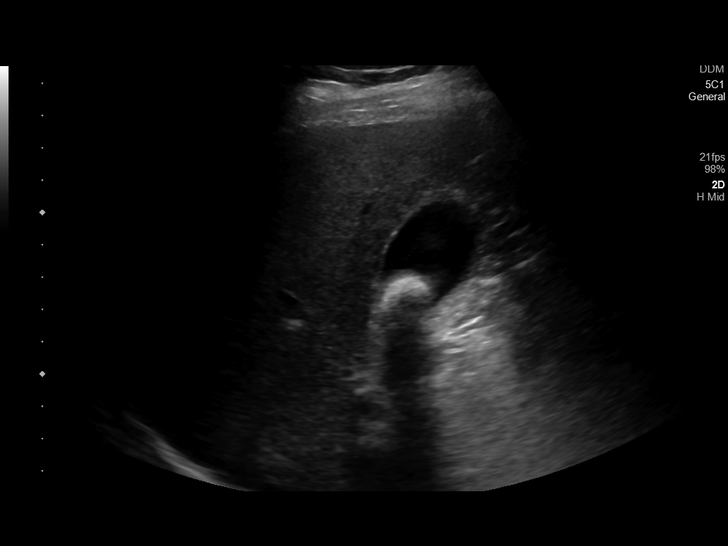
[im 37/50]
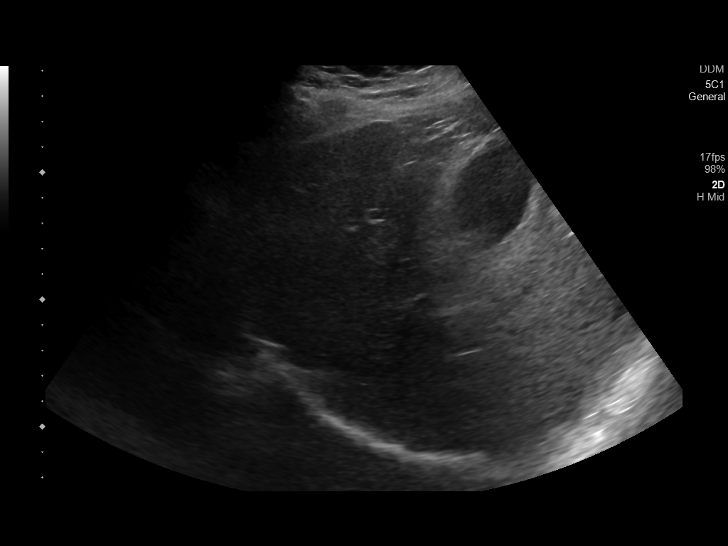
[im 41/50]
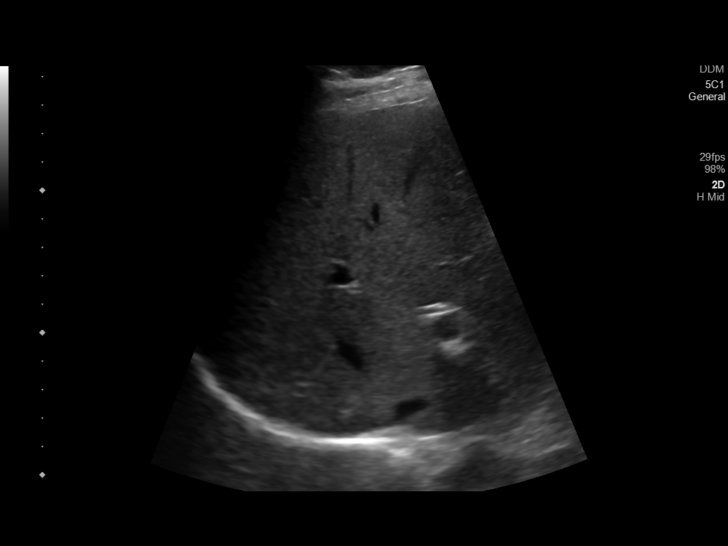
[im 45/50]
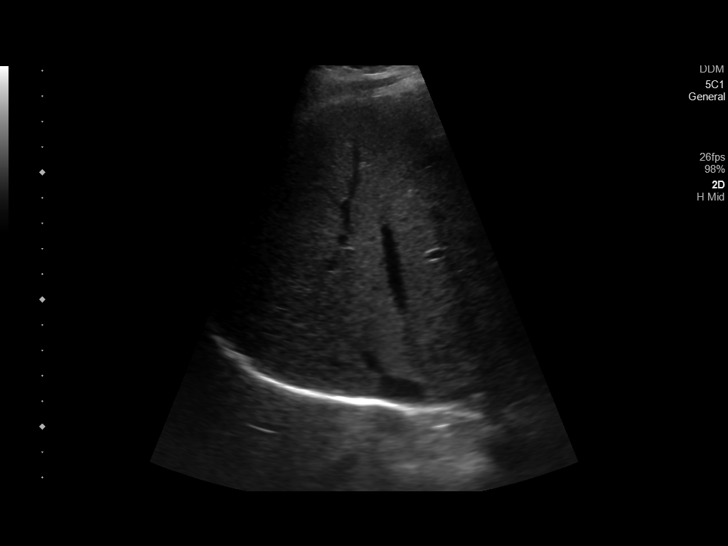
[im 50/50]
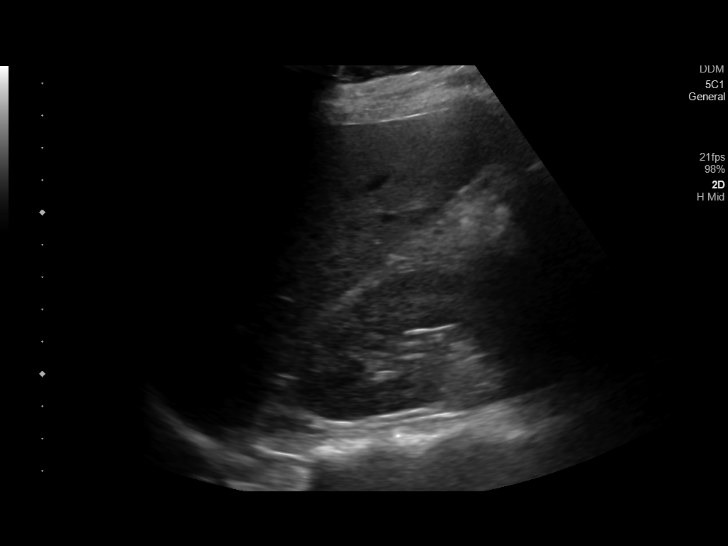

[14 of 25 positions shown; findings below may reference images not displayed]

FINDINGS: Gallbladder:

A single large gallstone measuring up to 2.7 cm is visualized.
Sludge is present. Gallbladder wall thickening, measuring up to 5
mm. No pericholecystic fluid. Negative sonographic Murphy sign.

Common bile duct:

Diameter:

Liver:

No focal lesion identified. Within normal limits in parenchymal
echogenicity. Portal vein is patent on color Doppler imaging with
normal direction of blood flow towards the liver.

Other: None.
IMPRESSION: Gallbladder wall thickening with a single large gallstone
visualized. Sonographic Murphy sign is negative. Findings are
equivocal for acute cholecystitis.
